# Patient Record
Sex: Female | Born: 1970
Health system: Southern US, Community
[De-identification: ages and names within clinical notes are randomized; demographics above are authoritative.]

## PROBLEM LIST (undated history)

## (undated) DIAGNOSIS — M1990 Unspecified osteoarthritis, unspecified site: Secondary | ICD-10-CM

## (undated) DIAGNOSIS — R053 Chronic cough: Secondary | ICD-10-CM

## (undated) DIAGNOSIS — K219 Gastro-esophageal reflux disease without esophagitis: Secondary | ICD-10-CM

## (undated) HISTORY — DX: Chronic cough: R05.3

## (undated) HISTORY — DX: Gastro-esophageal reflux disease without esophagitis: K21.9

## (undated) HISTORY — DX: Unspecified osteoarthritis, unspecified site: M19.90

---

## 2007-10-06 ENCOUNTER — Emergency Department (HOSPITAL_COMMUNITY): Admission: EM | Admit: 2007-10-06 | Discharge: 2007-10-06 | Payer: Self-pay | Admitting: Emergency Medicine

## 2008-02-11 ENCOUNTER — Emergency Department (HOSPITAL_COMMUNITY): Admission: EM | Admit: 2008-02-11 | Discharge: 2008-02-11 | Payer: Self-pay | Admitting: Emergency Medicine

## 2009-01-28 ENCOUNTER — Encounter: Payer: Self-pay | Admitting: Infectious Diseases

## 2009-01-28 DIAGNOSIS — M069 Rheumatoid arthritis, unspecified: Secondary | ICD-10-CM | POA: Insufficient documentation

## 2009-02-11 ENCOUNTER — Ambulatory Visit: Payer: Self-pay | Admitting: Infectious Diseases

## 2009-02-11 ENCOUNTER — Ambulatory Visit (HOSPITAL_COMMUNITY): Admission: RE | Admit: 2009-02-11 | Discharge: 2009-02-11 | Payer: Self-pay | Admitting: Infectious Diseases

## 2009-09-23 ENCOUNTER — Encounter: Admission: RE | Admit: 2009-09-23 | Discharge: 2009-09-23 | Payer: Self-pay | Admitting: Family Medicine

## 2011-05-15 ENCOUNTER — Ambulatory Visit (INDEPENDENT_AMBULATORY_CARE_PROVIDER_SITE_OTHER): Payer: BC Managed Care – PPO

## 2011-05-15 DIAGNOSIS — J111 Influenza due to unidentified influenza virus with other respiratory manifestations: Secondary | ICD-10-CM

## 2011-05-15 DIAGNOSIS — K5649 Other impaction of intestine: Secondary | ICD-10-CM

## 2012-08-22 ENCOUNTER — Ambulatory Visit (INDEPENDENT_AMBULATORY_CARE_PROVIDER_SITE_OTHER): Payer: BC Managed Care – PPO | Admitting: Family Medicine

## 2012-08-22 ENCOUNTER — Ambulatory Visit: Payer: BC Managed Care – PPO

## 2012-08-22 VITALS — BP 120/90 | HR 70 | Temp 98.3°F | Resp 16 | Wt 158.6 lb

## 2012-08-22 DIAGNOSIS — M542 Cervicalgia: Secondary | ICD-10-CM

## 2012-08-22 DIAGNOSIS — M25519 Pain in unspecified shoulder: Secondary | ICD-10-CM

## 2012-08-22 DIAGNOSIS — M25512 Pain in left shoulder: Secondary | ICD-10-CM

## 2012-08-22 DIAGNOSIS — R079 Chest pain, unspecified: Secondary | ICD-10-CM

## 2012-08-22 DIAGNOSIS — T148XXA Other injury of unspecified body region, initial encounter: Secondary | ICD-10-CM

## 2012-08-22 LAB — POCT CBC
Granulocyte percent: 47.1 %G (ref 37–80)
HCT, POC: 47 % (ref 37.7–47.9)
Hemoglobin: 15.2 g/dL (ref 12.2–16.2)
Lymph, poc: 3.4 (ref 0.6–3.4)
MCH, POC: 29.6 pg (ref 27–31.2)
MCHC: 32.3 g/dL (ref 31.8–35.4)
MCV: 91.6 fL (ref 80–97)
MID (cbc): 0.6 (ref 0–0.9)
MPV: 8.2 fL (ref 0–99.8)
POC Granulocyte: 3.6 (ref 2–6.9)
POC LYMPH PERCENT: 44.8 %L (ref 10–50)
POC MID %: 8.1 % (ref 0–12)
Platelet Count, POC: 394 10*3/uL (ref 142–424)
RBC: 5.13 M/uL (ref 4.04–5.48)
RDW, POC: 13.4 %
WBC: 7.7 10*3/uL (ref 4.6–10.2)

## 2012-08-22 MED ORDER — CYCLOBENZAPRINE HCL 5 MG PO TABS: 5.0000 mg | ORAL_TABLET | Freq: Every evening | ORAL | Status: DC | PRN

## 2012-08-22 NOTE — Progress Notes (Signed)
 [facility]:  Office Visit  Chief Complaint:  Chief Complaint  Patient presents with  . Chest Pain  . Neck Pain    feel hot on Lt side of neck and top of Lt Shoulder x yesterday afternoon    HPI: Lisa Chase is a 42 y.o. female who complains of neck pain on left  Side since yesterday, she has a sensation that muscles feel like strings taht are being pulled. Has had left anterior chest pain associated with this which also started yesterday at rest. Was napping then she woke up and had this pain, she sleeps on her left shoulder, head crooked. The CP was worse but now better, it is located in Left anterior wall. Yesterday was mild chest pain, now she feels better. Took ASA. Of pertinent interest she has RA and is methotrexate, last labs done in November.  Additionally her mom passed away from MI at age 65. Patient denies having HTN, does not know if she has high cholesterol. Does not have diabetes. Does not smoke.  She has already eaten today. No abd pain, no nausea/vomiting, pain radiates down from neck to shoulder to chest, not viceversa. No SOB, diaphoresis, palpitations, HAs, vision changes. .    Past Medical History  Diagnosis Date  . Arthritis     Rheumatoid dx 2010 on Methotrexate   History reviewed. No pertinent past surgical history. History   Social History  . Marital Status: Married    Spouse Name: N/A    Number of Children: N/A  . Years of Education: N/A   Social History Main Topics  . Smoking status: Never Smoker   . Smokeless tobacco: None  . Alcohol Use: No  . Drug Use: No  . Sexually Active: Yes   Other Topics Concern  . None   Social History Narrative  . None   Family History  Problem Relation Age of Onset  . Heart disease Mother    No Known Allergies Prior to Admission medications   Medication Sig Start Date End Date Taking? Authorizing Provider  methotrexate 1 G injection Inject into the vein once.   Yes Historical Provider, MD   predniSONE (DELTASONE) 5 MG tablet Take 5 mg by mouth daily.   Yes Historical Provider, MD     ROS: The patient denies fevers, chills, night sweats, unintentional weight loss, palpitations, wheezing, dyspnea on exertion, nausea, vomiting, abdominal pain, dysuria, hematuria, melena, numbness, weakness, or tingling.   All other systems have been reviewed and were otherwise negative with the exception of those mentioned in the HPI and as above.    PHYSICAL EXAM: Filed Vitals:   08/22/12 1954  BP: 120/90  Pulse: 70  Temp: 98.3 F (36.8 C)  Resp: 16   Filed Vitals:   08/22/12 1954  Weight: 158 lb 9.6 oz (71.94 kg)   Body mass index is 28.1 kg/(m^2).  General: Alert, no acute distress HEENT:  Normocephalic, atraumatic, oropharynx patent. EOMI, PERRLA Cardiovascular:  Regular rate and rhythm, no rubs murmurs or gallops.  No Carotid bruits, radial pulse intact. No pedal edema.  Respiratory: Clear to auscultation bilaterally.  No wheezes, rales, or rhonchi.  No cyanosis, no use of accessory musculature GI: No organomegaly, abdomen is soft and non-tender, positive bowel sounds.  No masses. Skin: No rashes. Neurologic: Facial musculature symmetric. Psychiatric: Patient is appropriate throughout our interaction. Lymphatic: No cervical lymphadenopathy Musculoskeletal: Gait intact. Neck exam-guarded. + paramsk tenderness, left more than right. Slight decrease in lateral  ROM  due to pain.  Thoracic and lumbar-normal Left shoulder-+ crepitus. Pain with Adduction. Full ROM, 5/5 neg Hawkins, Neg Neers. Neg Empty Can.  Chest  Wall-nontender with palpation.  2/2 DTRs  LABS: Results for orders placed in visit on 08/22/12  POCT CBC      Result Value Range   WBC 7.7  4.6 - 10.2 K/uL   Lymph, poc 3.4  0.6 - 3.4   POC LYMPH PERCENT 44.8  10 - 50 %L   MID (cbc) 0.6  0 - 0.9   POC MID % 8.1  0 - 12 %M   POC Granulocyte 3.6  2 - 6.9   Granulocyte percent 47.1  37 - 80 %G   RBC 5.13  4.04 -  5.48 M/uL   Hemoglobin 15.2  12.2 - 16.2 g/dL   HCT, POC 16.1  09.6 - 47.9 %   MCV 91.6  80 - 97 fL   MCH, POC 29.6  27 - 31.2 pg   MCHC 32.3  31.8 - 35.4 g/dL   RDW, POC 04.5     Platelet Count, POC 394  142 - 424 K/uL   MPV 8.2  0 - 99.8 fL     EKG/XRAY:   Primary read interpreted by Dr. Conley Rolls at [facility]. C-spine normal Chest-unchanged from 07/29/2010, no acute cardiopulmonary process Shoulder-+ minimal [facility] at [facility] jt EKG NSR 79 bpm    ASSESSMENT/PLAN: Encounter Diagnoses  Name Primary?  . Neck pain on left side Yes  . Pain in joint, shoulder region, left   . Chest pain   . Sprain and strain    She most likely has sprain and strain from sleeping wrong Rx Flexeril, ROM exercise Patient still has tramadol which she can take Awaiting CMP EKG, Chest xray and CBC were WNL so I don't think it is cardiac in origin  Go to ER prn for worsening CP/SOB   ,  PHUONG, DO 08/22/2012 9:25 PM

## 2012-08-23 LAB — COMPREHENSIVE METABOLIC PANEL WITH GFR
Albumin: 4.5 g/dL (ref 3.5–5.2)
Alkaline Phosphatase: 56 U/L (ref 39–117)
Calcium: 9.8 mg/dL (ref 8.4–10.5)
Creat: 0.92 mg/dL (ref 0.50–1.10)
Potassium: 4.2 meq/L (ref 3.5–5.3)
Sodium: 137 meq/L (ref 135–145)

## 2012-08-23 LAB — COMPREHENSIVE METABOLIC PANEL
ALT: 65 U/L — ABNORMAL HIGH (ref 0–35)
AST: 69 U/L — ABNORMAL HIGH (ref 0–37)
BUN: 12 mg/dL (ref 6–23)
CO2: 22 mEq/L (ref 19–32)
Chloride: 105 mEq/L (ref 96–112)
Glucose, Bld: 96 mg/dL (ref 70–99)
Total Bilirubin: 0.4 mg/dL (ref 0.3–1.2)
Total Protein: 7.8 g/dL (ref 6.0–8.3)

## 2012-08-24 ENCOUNTER — Telehealth: Payer: Self-pay | Admitting: Family Medicine

## 2012-08-24 ENCOUNTER — Encounter: Payer: Self-pay | Admitting: Family Medicine

## 2012-08-24 NOTE — Telephone Encounter (Signed)
LM on phone about lab results. Letter sent as well.

## 2014-04-16 ENCOUNTER — Encounter (HOSPITAL_BASED_OUTPATIENT_CLINIC_OR_DEPARTMENT_OTHER): Payer: Self-pay

## 2014-04-16 ENCOUNTER — Emergency Department (HOSPITAL_BASED_OUTPATIENT_CLINIC_OR_DEPARTMENT_OTHER)
Admission: EM | Admit: 2014-04-16 | Discharge: 2014-04-16 | Disposition: A | Discharge status: Home / Self Care | Payer: BLUE CROSS/BLUE SHIELD | Attending: Emergency Medicine | Admitting: Emergency Medicine

## 2014-04-16 DIAGNOSIS — R197 Diarrhea, unspecified: Secondary | ICD-10-CM | POA: Diagnosis not present

## 2014-04-16 DIAGNOSIS — Z7952 Long term (current) use of systemic steroids: Secondary | ICD-10-CM | POA: Insufficient documentation

## 2014-04-16 DIAGNOSIS — E860 Dehydration: Secondary | ICD-10-CM | POA: Diagnosis not present

## 2014-04-16 DIAGNOSIS — R52 Pain, unspecified: Secondary | ICD-10-CM | POA: Diagnosis present

## 2014-04-16 DIAGNOSIS — R51 Headache: Secondary | ICD-10-CM | POA: Diagnosis not present

## 2014-04-16 DIAGNOSIS — R1110 Vomiting, unspecified: Secondary | ICD-10-CM | POA: Diagnosis not present

## 2014-04-16 LAB — COMPREHENSIVE METABOLIC PANEL
ALBUMIN: 3.7 g/dL (ref 3.5–5.2)
ALK PHOS: 51 U/L (ref 39–117)
ALT: 82 U/L — ABNORMAL HIGH (ref 0–35)
ANION GAP: 14 (ref 5–15)
AST: 109 U/L — ABNORMAL HIGH (ref 0–37)
BILIRUBIN TOTAL: 0.3 mg/dL (ref 0.3–1.2)
BUN: 8 mg/dL (ref 6–23)
CALCIUM: 8.9 mg/dL (ref 8.4–10.5)
CO2: 24 mEq/L (ref 19–32)
Chloride: 102 mEq/L (ref 96–112)
Creatinine, Ser: 0.9 mg/dL (ref 0.50–1.10)
GFR calc Af Amer: 89 mL/min — ABNORMAL LOW (ref >90)
GFR, EST NON AFRICAN AMERICAN: 77 mL/min — AB (ref >90)
Glucose, Bld: 110 mg/dL — ABNORMAL HIGH (ref 70–99)
POTASSIUM: 3.9 meq/L (ref 3.7–5.3)
SODIUM: 140 meq/L (ref 137–147)
Total Protein: 7.8 g/dL (ref 6.0–8.3)

## 2014-04-16 LAB — CBC WITH DIFFERENTIAL/PLATELET
Basophils Absolute: 0 10*3/uL (ref 0.0–0.1)
Basophils Relative: 0 % (ref 0–1)
EOS ABS: 0 10*3/uL (ref 0.0–0.7)
Eosinophils Relative: 0 % (ref 0–5)
HEMATOCRIT: 45.5 % (ref 36.0–46.0)
HEMOGLOBIN: 15.4 g/dL — AB (ref 12.0–15.0)
LYMPHS ABS: 1.2 10*3/uL (ref 0.7–4.0)
LYMPHS PCT: 56 % — AB (ref 12–46)
MCH: 29.2 pg (ref 26.0–34.0)
MCHC: 33.8 g/dL (ref 30.0–36.0)
MCV: 86.3 fL (ref 78.0–100.0)
MONO ABS: 0.3 10*3/uL (ref 0.1–1.0)
MONOS PCT: 16 % — AB (ref 3–12)
NEUTROS ABS: 0.6 10*3/uL — AB (ref 1.7–7.7)
Neutrophils Relative %: 28 % — ABNORMAL LOW (ref 43–77)
PLATELETS: 167 10*3/uL (ref 150–400)
RBC: 5.27 MIL/uL — ABNORMAL HIGH (ref 3.87–5.11)
RDW: 14.2 % (ref 11.5–15.5)
WBC: 2.1 10*3/uL — AB (ref 4.0–10.5)

## 2014-04-16 LAB — URINE MICROSCOPIC-ADD ON

## 2014-04-16 LAB — URINALYSIS, ROUTINE W REFLEX MICROSCOPIC
BILIRUBIN URINE: NEGATIVE
Glucose, UA: NEGATIVE mg/dL
KETONES UR: 15 mg/dL — AB
Leukocytes, UA: NEGATIVE
NITRITE: NEGATIVE
PH: 7 (ref 5.0–8.0)
Specific Gravity, Urine: 1.025 (ref 1.005–1.030)
UROBILINOGEN UA: 1 mg/dL (ref 0.0–1.0)

## 2014-04-16 MED ORDER — ONDANSETRON HCL 4 MG/2ML IJ SOLN
4.0000 mg | Freq: Once | INTRAMUSCULAR | Status: AC
  Administered 2014-04-16: 4 mg via INTRAVENOUS
  Filled 2014-04-16: qty 2

## 2014-04-16 MED ORDER — CIPROFLOXACIN HCL 500 MG PO TABS: 500.0000 mg | ORAL_TABLET | Freq: Two times a day (BID) | ORAL | Status: DC

## 2014-04-16 MED ORDER — LOPERAMIDE HCL 2 MG PO CAPS
2.0000 mg | ORAL_CAPSULE | Freq: Once | ORAL | Status: AC
  Administered 2014-04-16: 2 mg via ORAL
  Filled 2014-04-16: qty 1

## 2014-04-16 MED ORDER — PROMETHAZINE HCL 25 MG PO TABS: 25.0000 mg | ORAL_TABLET | Freq: Four times a day (QID) | ORAL | Status: DC | PRN

## 2014-04-16 MED ORDER — SODIUM CHLORIDE 0.9 % IV BOLUS (SEPSIS)
1000.0000 mL | Freq: Once | INTRAVENOUS | Status: AC
  Administered 2014-04-16: 1000 mL via INTRAVENOUS

## 2014-04-16 NOTE — ED Notes (Signed)
Pt reports body aches, chills,no fever,nausea but no vomiting. [facility].

## 2014-04-16 NOTE — ED Provider Notes (Signed)
CSN: [id number]     Arrival date & time 04/16/14  1116 History   First MD Initiated Contact with Patient 04/16/14 1134     Chief Complaint  Patient presents with  . Generalized Body Aches     (Consider location/radiation/quality/duration/timing/severity/associated sxs/prior Treatment) Patient is a 43 y.o. female presenting with vomiting. The history is provided by the patient.  Emesis Severity:  Moderate Duration:  4 days Timing:  Sporadic Number of daily episodes:  Only a few Quality:  Stomach contents Progression:  Unchanged Chronicity:  New Recent urination:  Normal Relieved by:  Nothing Associated symptoms: chills, diarrhea, fever and headaches   Associated symptoms: no abdominal pain, no cough, no sore throat and no URI   Diarrhea:    Quality:  Watery   Number of occurrences:  4-5 episodes a day but much worse if she eats   Severity:  Moderate   Duration:  4 days   Timing:  Constant   Progression:  Unchanged Risk factors: travel to endemic areas   Risk factors: no alcohol use and no prior abdominal surgery   Risk factors comment:  Recently traveled back from [place] last week where she had been for the last month   Past Medical History  Diagnosis Date  . Arthritis     Rheumatoid dx 2010 on Methotrexate   History reviewed. No pertinent past surgical history. Family History  Problem Relation Age of Onset  . Heart disease Mother    History  Substance Use Topics  . Smoking status: Never Smoker   . Smokeless tobacco: Not on file  . Alcohol Use: No   OB History    No data available     Review of Systems  Constitutional: Positive for chills.  HENT: Negative for sore throat.   Gastrointestinal: Positive for vomiting and diarrhea. Negative for abdominal pain.  Neurological: Positive for headaches.  All other systems reviewed and are negative.     Allergies  Review of patient's allergies indicates no known allergies.  Home Medications   Prior to  Admission medications   Medication Sig Start Date End Date Taking? Authorizing Provider  cyclobenzaprine (FLEXERIL) 5 MG tablet Take 1 tablet (5 mg total) by mouth at bedtime as needed for muscle spasms. 08/22/12   Thao P Le, DO  methotrexate 1 G injection Inject into the vein once.    Historical Provider, MD  predniSONE (DELTASONE) 5 MG tablet Take 5 mg by mouth daily.    Historical Provider, MD   BP 116/47 mmHg  Pulse 64  Temp(Src) 98 F (36.7 C) (Oral)  Resp 22  SpO2 99%  LMP 04/11/2014 Physical Exam  Constitutional: She is oriented to person, place, and time. She appears well-developed and well-nourished. No distress.  HENT:  Head: Normocephalic and atraumatic.  Mouth/Throat: Mucous membranes are dry.  Eyes: EOM are normal. Pupils are equal, round, and reactive to light.  Cardiovascular: Normal rate, regular rhythm, normal heart sounds and intact distal pulses.  Exam reveals no friction rub.   No murmur heard. Pulmonary/Chest: Effort normal and breath sounds normal. She has no wheezes. She has no rales.  Abdominal: Soft. Bowel sounds are normal. She exhibits no distension. There is no tenderness. There is no rebound and no guarding.  Musculoskeletal: Normal range of motion. She exhibits no tenderness.  Chronic deformity in the hands consistent with RA.  No edema  Neurological: She is alert and oriented to person, place, and time. No cranial nerve deficit.  Skin: Skin is  warm and dry. No rash noted.  Psychiatric: She has a normal mood and affect. Her behavior is normal.  Nursing note and vitals reviewed.   ED Course  Procedures (including critical care time) Labs Review Labs Reviewed  CBC WITH DIFFERENTIAL - Abnormal; Notable for the following:    WBC 2.1 (*)    RBC 5.27 (*)    Hemoglobin 15.4 (*)    Neutrophils Relative % 28 (*)    Lymphocytes Relative 56 (*)    Monocytes Relative 16 (*)    Neutro Abs 0.6 (*)    All other components within normal limits  COMPREHENSIVE  METABOLIC PANEL - Abnormal; Notable for the following:    Glucose, Bld 110 (*)    AST 109 (*)    ALT 82 (*)    GFR calc non Af Amer 77 (*)    GFR calc Af Amer 89 (*)    All other components within normal limits  URINALYSIS, ROUTINE W REFLEX MICROSCOPIC - Abnormal; Notable for the following:    Color, Urine AMBER (*)    APPearance CLOUDY (*)    Hgb urine dipstick MODERATE (*)    Ketones, ur 15 (*)    Protein, ur >300 (*)    All other components within normal limits  URINE MICROSCOPIC-ADD ON - Abnormal; Notable for the following:    Squamous Epithelial / LPF FEW (*)    Bacteria, UA MANY (*)    All other components within normal limits  OVA AND PARASITE EXAMINATION  GI PATHOGEN PANEL BY PCR, STOOL  PATHOLOGIST SMEAR REVIEW    Imaging Review No results found.   EKG Interpretation None      MDM   Final diagnoses:  None    Patient here with 4 days of fever, chills, nausea and diarrhea. Also complaining of some lower back pain. Patient denies any abdominal pain, respiratory or cardiac symptoms. She recently traveled back from [place] where she was for month.  Patient is immunocompromised as she takes methotrexate and prednisone for rheumatoid arthritis. She currently is well-appearing with normal vital signs. She has no focal abdominal tenderness but concern for possible foodborne illness or parasitic infection.  CBC, CMP, stool ova and parasite as well as GI panel by PCR pending. Patient given IV fluids and Zofran  2:03 PM Pt po challenged without vomiting.  Given imodium for diarrhea.  Labs show luekocytosis which she will have f/u and recheck as well as mild elevation of LFT's which is most likely due to dehydration.  Pt will be treated with cipro.  Gwyneth Sprout, MD 04/16/14 [phone]

## 2014-04-16 NOTE — ED Notes (Signed)
Pt presents with numerous complaints, nausea, headache, diarrhea, cough, dizziness and pain to back.

## 2014-04-16 NOTE — ED Notes (Signed)
MD at bedside. 

## 2014-04-16 NOTE — Discharge Instructions (Signed)
Dehydration, Adult Dehydration is when you lose more fluids from the body than you take in. Vital organs like the kidneys, brain, and heart cannot function without a proper amount of fluids and salt. Any loss of fluids from the body can cause dehydration.  CAUSES   Vomiting.  Diarrhea.  Excessive sweating.  Excessive urine output.  Fever. SYMPTOMS  Mild dehydration  Thirst.  Dry lips.  Slightly dry mouth. Moderate dehydration  Very dry mouth.  Sunken eyes.  Skin does not bounce back quickly when lightly pinched and released.  Dark urine and decreased urine production.  Decreased tear production.  Headache. Severe dehydration  Very dry mouth.  Extreme thirst.  Rapid, weak pulse (more than 100 beats per minute at rest).  Cold hands and feet.  Not able to sweat in spite of heat and temperature.  Rapid breathing.  Blue lips.  Confusion and lethargy.  Difficulty being awakened.  Minimal urine production.  No tears. DIAGNOSIS  Your caregiver will diagnose dehydration based on your symptoms and your exam. Blood and urine tests will help confirm the diagnosis. The diagnostic evaluation should also identify the cause of dehydration. TREATMENT  Treatment of mild or moderate dehydration can often be done at home by increasing the amount of fluids that you drink. It is best to drink small amounts of fluid more often. Drinking too much at one time can make vomiting worse. Refer to the home care instructions below. Severe dehydration needs to be treated at the hospital where you will probably be given intravenous (IV) fluids that contain water and electrolytes. HOME CARE INSTRUCTIONS   Ask your caregiver about specific rehydration instructions.  Drink enough fluids to keep your urine clear or pale yellow.  Drink small amounts frequently if you have nausea and vomiting.  Eat as you normally do.  Avoid:  Foods or drinks high in sugar.  Carbonated  drinks.  Juice.  Extremely hot or cold fluids.  Drinks with caffeine.  Fatty, greasy foods.  Alcohol.  Tobacco.  Overeating.  Gelatin desserts.  Wash your hands well to avoid spreading bacteria and viruses.  Only take over-the-counter or prescription medicines for pain, discomfort, or fever as directed by your caregiver.  Ask your caregiver if you should continue all prescribed and over-the-counter medicines.  Keep all follow-up appointments with your caregiver. SEEK MEDICAL CARE IF:  You have abdominal pain and it increases or stays in one area (localizes).  You have a rash, stiff neck, or severe headache.  You are irritable, sleepy, or difficult to awaken.  You are weak, dizzy, or extremely thirsty. SEEK IMMEDIATE MEDICAL CARE IF:   You are unable to keep fluids down or you get worse despite treatment.  You have frequent episodes of vomiting or diarrhea.  You have blood or green matter (bile) in your vomit.  You have blood in your stool or your stool looks black and tarry.  You have not urinated in 6 to 8 hours, or you have only urinated a small amount of very dark urine.  You have a fever.  You faint. MAKE SURE YOU:   Understand these instructions.  Will watch your condition.  Will get help right away if you are not doing well or get worse. Document Released: 05/23/2005 Document Revised: 08/15/2011 Document Reviewed: 01/10/2011 ExitCare Patient Information 2015 [employer]. This information is not intended to replace advice given to you by your health care provider. Make sure you discuss any questions you have with your health care   provider.  

## 2014-04-17 LAB — GI PATHOGEN PANEL BY PCR, STOOL
C DIFFICILE TOXIN A/B: NEGATIVE
CAMPYLOBACTER BY PCR: NEGATIVE
CRYPTOSPORIDIUM BY PCR: NEGATIVE
E COLI (ETEC) LT/ST: NEGATIVE
E coli (STEC): NEGATIVE
E coli 0157 by PCR: NEGATIVE
G lamblia by PCR: NEGATIVE
NOROVIRUS G1/G2: NEGATIVE
Rotavirus A by PCR: NEGATIVE
SHIGELLA BY PCR: NEGATIVE
Salmonella by PCR: NEGATIVE

## 2014-04-17 LAB — PATHOLOGIST SMEAR REVIEW

## 2016-08-08 MED FILL — NAPROXEN 500 MG TABLET: 500 | 90 days supply | Qty: 180 | Fill #0

## 2016-08-08 MED FILL — FOLIC ACID 1 MG TABLET: 1 | 90 days supply | Qty: 90 | Fill #0

## 2016-08-08 MED FILL — predniSONE 10 MG TABS: 10 | 90 days supply | Qty: 90 | Fill #0

## 2016-08-09 MED FILL — METHOTREXATE 25 MG/ML VIAL: 50 | 28 days supply | Qty: 4 | Fill #0

## 2016-08-09 MED FILL — BD TB SYRINGE 27GX1/2: 27G X 1/2" | 28 days supply | Qty: 4 | Fill #0

## 2016-08-09 MED FILL — BD TB SYRINGE 27GX1/2": 27G X 1/2" | 28 days supply | Qty: 4 | Fill #0

## 2016-09-28 DIAGNOSIS — Z79899 Other long term (current) drug therapy: Secondary | ICD-10-CM | POA: Diagnosis not present

## 2016-09-28 DIAGNOSIS — M0589 Other rheumatoid arthritis with rheumatoid factor of multiple sites: Secondary | ICD-10-CM | POA: Diagnosis not present

## 2016-09-28 DIAGNOSIS — R7611 Nonspecific reaction to tuberculin skin test without active tuberculosis: Secondary | ICD-10-CM | POA: Diagnosis not present

## 2016-09-28 DIAGNOSIS — R5383 Other fatigue: Secondary | ICD-10-CM | POA: Diagnosis not present

## 2017-01-17 MED FILL — FOLIC ACID 1 MG TABLET: 1 | 90 days supply | Qty: 90 | Fill #0

## 2017-01-17 MED FILL — NAPROXEN 500 MG TABLET: 500 | 90 days supply | Qty: 180 | Fill #0

## 2017-01-26 ENCOUNTER — Encounter (HOSPITAL_COMMUNITY): Payer: Self-pay | Admitting: Emergency Medicine

## 2017-01-26 ENCOUNTER — Ambulatory Visit (HOSPITAL_COMMUNITY)
Admission: EM | Admit: 2017-01-26 | Discharge: 2017-01-26 | Disposition: A | Discharge status: Home / Self Care | Payer: 59 | Attending: Emergency Medicine | Admitting: Emergency Medicine

## 2017-01-26 DIAGNOSIS — M25611 Stiffness of right shoulder, not elsewhere classified: Secondary | ICD-10-CM

## 2017-01-26 DIAGNOSIS — H6691 Otitis media, unspecified, right ear: Secondary | ICD-10-CM | POA: Diagnosis not present

## 2017-01-26 DIAGNOSIS — M62838 Other muscle spasm: Secondary | ICD-10-CM

## 2017-01-26 DIAGNOSIS — M25511 Pain in right shoulder: Secondary | ICD-10-CM

## 2017-01-26 MED ORDER — CYCLOBENZAPRINE HCL 5 MG PO TABS: 5.0000 mg | ORAL_TABLET | Freq: Two times a day (BID) | ORAL | 0 refills | Status: DC | PRN

## 2017-01-26 MED ORDER — AZITHROMYCIN 250 MG PO TABS: 250.0000 mg | ORAL_TABLET | Freq: Every day | ORAL | 0 refills | Status: DC

## 2017-01-26 MED FILL — CYCLOBENZAPRINE 5 MG TABLET: 5 | 5 days supply | Qty: 20 | Fill #0

## 2017-01-26 MED FILL — AZITHROMYCIN 250 MG TABLET: 250 | 5 days supply | Qty: 6 | Fill #0

## 2017-01-26 NOTE — ED Provider Notes (Signed)
[facility]    CSN: [id number] Arrival date & time: 01/26/17  1002     History   Chief Complaint Chief Complaint  Patient presents with  . Otalgia  . Extremity Pain    HPI Lisa Chase is a 46 y.o. female.   HPI Lisa Chase is a 46 y.o. female presenting to UC with c/o sudden onset Right shoulder pain with decreased ROM as well as Right ear pain that is aching and throbbing. Both symptoms started yesterday.  She notes she has been typing more recently and is Right hand dominant. No known injury.  Pain is 6/10, aching and sore. Hx of arthritis.  She is on methotrexate.  She did take Naprosyn that was prescribed by her PCP in the past. No relief. She is new to the area and plans to find a PCP soon. Right ear pain is aching and sore, mild to moderate in severity. She has had mild cough and congestion for about 1 week. Denies fever, chills, n/v/d.    Past Medical History:  Diagnosis Date  . Arthritis    Rheumatoid dx 2010 on Methotrexate    Patient Active Problem List   Diagnosis Date Noted  . ARTHRITIS, RHEUMATOID 01/28/2009  . 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 No pertinent surgical history.  OB History    No data available       Home Medications    Prior to Admission medications   Medication Sig Start Date End Date Taking? Authorizing Provider  azithromycin (ZITHROMAX) 250 MG tablet Take 1 tablet (250 mg total) by mouth daily. Take first 2 tablets together, then 1 every day until finished. 01/26/17   Lurene Shadow, PA-C  ciprofloxacin (CIPRO) 500 MG tablet Take 1 tablet (500 mg total) by mouth 2 (two) times daily. 04/16/14   Gwyneth Sprout, MD  cyclobenzaprine (FLEXERIL) 5 MG tablet Take 1-2 tablets (5-10 mg total) by mouth 2 (two) times daily as needed for muscle spasms. 01/26/17   Lurene Shadow, PA-C  methotrexate 1 G injection Inject into the vein once.    [provider]  predniSONE (DELTASONE) 5 MG tablet Take 5 mg  by mouth daily.    [provider]  promethazine (PHENERGAN) 25 MG tablet Take 1 tablet (25 mg total) by mouth every 6 (six) hours as needed for nausea or vomiting. 04/16/14   Gwyneth Sprout, MD    Family History Family History  Problem Relation Age of Onset  . Heart disease Mother     Social History Social History  Substance Use Topics  . Smoking status: Never Smoker  . Smokeless tobacco: Never Used  . Alcohol use No     Allergies   Patient has no known allergies.   Review of Systems Review of Systems  Constitutional: Negative for chills and fever.  HENT: Positive for congestion, ear pain (Right) and rhinorrhea. Negative for sore throat, trouble swallowing and voice change.   Respiratory: Positive for cough. Negative for shortness of breath.   Cardiovascular: Negative for chest pain and palpitations.  Gastrointestinal: Negative for abdominal pain, diarrhea, nausea and vomiting.  Musculoskeletal: Positive for arthralgias, back pain and myalgias. Negative for joint swelling.  Skin: Negative for rash.  Neurological: Positive for weakness ( Right arm due to pain and stiffness). Negative for numbness.     Physical Exam Triage Vital Signs ED Triage Vitals  Enc Vitals Group     BP 01/26/17 1022 (!) 145/80  Pulse Rate 01/26/17 1022 72     Resp 01/26/17 1022 16     Temp 01/26/17 1022 98.1 F (36.7 C)     Temp Source 01/26/17 1022 Oral     SpO2 01/26/17 1022 98 %     Weight 01/26/17 1029 159 lb (72.1 kg)     Height 01/26/17 1029 5\' 3"  (1.6 m)     Head Circumference --      Peak Flow --      Pain Score 01/26/17 1029 6     Pain Loc --      Pain Edu? --      Excl. in GC? --    No data found.   Updated Vital Signs BP (!) 145/80 (BP Location: Left Arm) Comment: reported BP to nurse 01/28/17  Pulse 72   Temp 98.1 F (36.7 C) (Oral)   Resp 16   Ht 5\' 3"  (1.6 m)   Wt 159 lb (72.1 kg)   SpO2 98%   BMI 28.17 kg/m   Visual Acuity Right Eye  Distance:   Left Eye Distance:   Bilateral Distance:    Right Eye Near:   Left Eye Near:    Bilateral Near:     Physical Exam  Constitutional: She is oriented to person, place, and time. She appears well-developed and well-nourished. No distress.  HENT:  Head: Normocephalic and atraumatic.  Right Ear: Tympanic membrane is erythematous. Tympanic membrane is not bulging. A middle ear effusion is present.  Left Ear: Tympanic membrane normal.  Nose: Nose normal.  Mouth/Throat: Uvula is midline, oropharynx is clear and moist and mucous membranes are normal.  Eyes: EOM are normal.  Neck: Normal range of motion. Neck supple.  No midline spinal tenderness. Tenderness to Right side cervical muscles.   Cardiovascular: Normal rate and regular rhythm.   Pulmonary/Chest: Effort normal and breath sounds normal. No stridor. No respiratory distress. She has no wheezes. She has no rales.  Musculoskeletal: She exhibits tenderness.  No midline spinal tenderness. Tenderness to Right upper trapezius and posterior shoulder joint. Decreased ROM. Unable to abduct arm past 90 degrees due to pain and stiffness. No crepitus felt on exam. Able to fully adduct Right arm. 4/5 grip strength bilaterally. Bilateral hands- slight deformity c/w RA  Lymphadenopathy:    She has no cervical adenopathy.  Neurological: She is alert and oriented to person, place, and time.  Skin: Skin is warm and dry. No rash noted. She is not diaphoretic. No erythema.  Psychiatric: She has a normal mood and affect. Her behavior is normal.  Nursing note and vitals reviewed.    UC Treatments / Results  Labs (all labs ordered are listed, but only abnormal results are displayed) Labs Reviewed - No data to display  EKG  EKG Interpretation None       Radiology No results found.  Procedures Procedures (including critical care time)  Medications Ordered in UC Medications - No data to display   Initial Impression / Assessment  and Plan / UC Course  I have reviewed the triage vital signs and the nursing notes.  Pertinent labs & imaging results that were available during my care of the patient were reviewed by me and considered in my medical decision making (see chart for details).     Right shoulder pain and decreased ROM secondary to muscle spasm of Right upper trapezius. No known injury. No bony tenderness. Known hx of arthritis. No indication for imaging at this time. Will try conservative  treatment.  Right ear exam c/w AOM  Final Clinical Impressions(s) / UC Diagnoses   Final diagnoses:  Right acute otitis media  Trapezius muscle spasm  Acute pain of right shoulder  Decreased range of motion of right shoulder   Home care instructions provided Encouraged f/u with a PCP and orthopedist for Right shoulder.   New Prescriptions New Prescriptions   AZITHROMYCIN (ZITHROMAX) 250 MG TABLET    Take 1 tablet (250 mg total) by mouth daily. Take first 2 tablets together, then 1 every day until finished.   CYCLOBENZAPRINE (FLEXERIL) 5 MG TABLET    Take 1-2 tablets (5-10 mg total) by mouth 2 (two) times daily as needed for muscle spasms.     Controlled Substance Prescriptions [place] Controlled Substance Registry consulted? Not Applicable   Rolla Plate 01/26/17 1111

## 2017-01-26 NOTE — ED Triage Notes (Signed)
PT reports right arm pain with inability to lift arm all the way. This started yesterday. History of arthritis.   PT reports right ear pain that started yesterday.

## 2017-01-26 NOTE — Discharge Instructions (Signed)
°  No Primary Care Doctor: Call [facility] at  [phone] - they can help you locate a primary care doctor that  accepts your insurance, provides certain services, etc. Physician Referral Service- [phone]  Please contact one of the orthopedic clinics listed above for further evaluated and treatment of Right shoulder pain and stiffness. Once you establish care with a Primary Care Provider, they may refer you to a rheumatologist for further evaluation and treatment of shoulder pain and stiffness.

## 2017-02-09 ENCOUNTER — Ambulatory Visit (HOSPITAL_COMMUNITY)
Admission: EM | Admit: 2017-02-09 | Discharge: 2017-02-09 | Disposition: A | Discharge status: Home / Self Care | Payer: 59 | Attending: Internal Medicine | Admitting: Internal Medicine

## 2017-02-09 ENCOUNTER — Encounter (HOSPITAL_COMMUNITY): Payer: Self-pay | Admitting: Nurse Practitioner

## 2017-02-09 DIAGNOSIS — B9789 Other viral agents as the cause of diseases classified elsewhere: Secondary | ICD-10-CM

## 2017-02-09 DIAGNOSIS — J069 Acute upper respiratory infection, unspecified: Secondary | ICD-10-CM | POA: Diagnosis not present

## 2017-02-09 MED ORDER — IPRATROPIUM BROMIDE 0.06 % NA SOLN: 2.0000 | Freq: Four times a day (QID) | NASAL | 0 refills | Status: DC

## 2017-02-09 MED ORDER — GUAIFENESIN-CODEINE 100-10 MG/5ML PO SOLN: 5.0000 mL | Freq: Three times a day (TID) | ORAL | 0 refills | Status: DC | PRN

## 2017-02-09 NOTE — ED Triage Notes (Signed)
Pt presents with c/o cough. The cough began two days ago. The cough is nonproductive. She feels short of breath. She denies fevers, pain. She has tried tylenol cough/cold with no relief. She has been unable to sleep for the past two night due to cough

## 2017-02-09 NOTE — Discharge Instructions (Signed)
You most likely have a viral URI, this type of infection will not be helped by antibiotics.  For your symptoms I have prescribed ipratropium nasal spray, 2 sprays each nostril up to 4 times a day. I have also prescribed a medicine for cough called Cheratussin with codeine, this medicine is a narcotic, it will cause drowsiness, and it is addictive. Do not take more than what is necessary, do not drink alcohol while taking, and do not operate any heavy machinery while taking this medicine.    In addition to these therapies, I advise rest, plenty of fluids and management of symptoms with over the counter medicines. Over the counter therapies for your symptoms include:Tylenol as needed every 4-6 hours for body aches or fever, not to exceed 4,000 mg a day, Take mucinex or mucinex DM ever 12 hours with a full glass of water, you may use an inhaled steroid such as Flonase, 2 sprays each nostril once a day for congestion, or an antihistamine such as Claritin or Zyrtec once a day. Another alternative for congestion, is a pseudoephedrine containing product available from the pharmacist. Should your symptoms worsen or fail to resolve, follow up with your primary care provider or return to clinic.

## 2017-02-09 NOTE — ED Provider Notes (Signed)
[facility]   [id number] 02/09/17 Arrival Time: 1704   SUBJECTIVE:  Kymani Laursen is a 46 y.o. female who presents to the urgent care with complaint of cough for 24 hours, interfering with her sleep. Along with nasal congestion and runny nose as well. No ear pain or pressure, no nausea, vomiting, diarrhea, no chest pain or palpitations, wheezing, or shortness of breath. No fever or chills, no change in appetite. Does not smoke, works as a Pharmacologist.     Past Medical History:  Diagnosis Date  . Arthritis    Rheumatoid dx 2010 on Methotrexate   Family History  Problem Relation Age of Onset  . Heart disease Mother    Social History   Social History  . Marital status: Married    Spouse name: N/A  . Number of children: N/A  . Years of education: N/A   Occupational History  . Not on file.   Social History Main Topics  . Smoking status: Never Smoker  . Smokeless tobacco: Never Used  . Alcohol use No  . Drug use: No  . Sexual activity: Yes   Other Topics Concern  . Not on file   Social History Narrative  . No narrative on file   Current Meds  Medication Sig  . folic acid (FOLVITE) 1 MG tablet Take 1 mg by mouth daily.  . methotrexate 1 G injection Inject into the vein once.  . [DISCONTINUED] predniSONE (DELTASONE) 5 MG tablet Take 5 mg by mouth daily.   No Known Allergies    ROS: As per HPI, remainder of ROS negative.   OBJECTIVE:   Vitals:   02/09/17 1721  BP: 135/76  Pulse: 81  Resp: 16  Temp: 98.8 F (37.1 C)  TempSrc: Oral  SpO2: 97%     General appearance: alert; no distress Eyes: PERRL; EOMI; conjunctiva normal HENT: normocephalic; atraumatic; TMs normal, canal normal, external ears normal without trauma; nasal mucosa normal; oral mucosa normal Neck: suppleNo cervical lymphadenopathy Lungs: clear to auscultation bilaterally Heart: regular rate and rhythm Abdomen: soft, non-tender; bowel sounds normal; no masses or  organomegaly; no guarding or rebound tenderness Back: no CVA tenderness Extremities: no cyanosis or edema; symmetrical with no gross deformities Skin: warm and dry Neurologic: normal gait; grossly normal Psychological: alert and cooperative; normal mood and affect      Labs:   Labs Reviewed - No data to display  No results found.     ASSESSMENT & PLAN:  1. Viral URI with cough     Meds ordered this encounter  Medications  . folic acid (FOLVITE) 1 MG tablet    Sig: Take 1 mg by mouth daily.  Marland Kitchen ipratropium (ATROVENT) 0.06 % nasal spray    Sig: Place 2 sprays into both nostrils 4 (four) times daily.    Dispense:  15 mL    Refill:  0    Order Specific Question:   Supervising Provider    Answer:   Eustace Moore [[id number]]  . guaiFENesin-codeine 100-10 MG/5ML syrup    Sig: Take 5 mLs by mouth 3 (three) times daily as needed for cough.    Dispense:  120 mL    Refill:  0    Order Specific Question:   Supervising Provider    Answer:   Eustace Moore [[id number]]    Reviewed expectations re: course of current medical issues. Questions answered. Outlined signs and symptoms indicating need for more acute intervention. Patient verbalized understanding. After Visit Summary  given.    Procedures:        Dorena Bodo, NP 02/09/17 1759

## 2017-02-10 MED FILL — IPRATROPIUM 0.06% SPRAY: 0.06 | 10 days supply | Qty: 15 | Fill #0

## 2017-03-17 MED FILL — HYDROCODON-APAP 5-325: 5-325 | 4 days supply | Qty: 25 | Fill #0

## 2017-03-17 MED FILL — AMOXICILLIN 500 MG CAPSULE: 500 | 3 days supply | Qty: 9 | Fill #0

## 2017-08-09 ENCOUNTER — Ambulatory Visit: Payer: BLUE CROSS/BLUE SHIELD | Admitting: Family Medicine

## 2017-08-10 ENCOUNTER — Encounter: Payer: Self-pay | Admitting: Family Medicine

## 2017-08-10 ENCOUNTER — Encounter (INDEPENDENT_AMBULATORY_CARE_PROVIDER_SITE_OTHER): Payer: Self-pay

## 2017-08-10 ENCOUNTER — Ambulatory Visit (INDEPENDENT_AMBULATORY_CARE_PROVIDER_SITE_OTHER): Payer: 59 | Admitting: Family Medicine

## 2017-08-10 VITALS — BP 132/86 | HR 84 | Temp 98.4°F | Ht 63.0 in | Wt 161.0 lb

## 2017-08-10 DIAGNOSIS — L0100 Impetigo, unspecified: Secondary | ICD-10-CM

## 2017-08-10 DIAGNOSIS — M069 Rheumatoid arthritis, unspecified: Secondary | ICD-10-CM

## 2017-08-10 DIAGNOSIS — K219 Gastro-esophageal reflux disease without esophagitis: Secondary | ICD-10-CM | POA: Diagnosis not present

## 2017-08-10 DIAGNOSIS — Z7689 Persons encountering health services in other specified circumstances: Secondary | ICD-10-CM

## 2017-08-10 MED ORDER — MUPIROCIN 2 % EX OINT: 1.0000 "application " | TOPICAL_OINTMENT | Freq: Two times a day (BID) | CUTANEOUS | 0 refills | Status: AC

## 2017-08-10 MED FILL — MUPIROCIN 2% OINTMENT: 2 | 5 days supply | Qty: 22 | Fill #0

## 2017-08-10 NOTE — Patient Instructions (Addendum)
Impetigo, Adult Impetigo is an infection of the skin. It commonly occurs in young children, but it can also occur in adults. The infection causes itchy blisters and sores that produce brownish-yellow fluid. As the fluid dries, it forms a thick, honey-colored crust. These skin changes usually occur on the face but can also affect other areas of the body. Impetigo usually goes away in 7-10 days with treatment. What are the causes? Impetigo is caused by two types of bacteria. It may be caused by staphylococci or streptococci bacteria. These bacteria cause impetigo when they get under the surface of the skin. This often happens after some damage to the skin, such as damage from:  Cuts, scrapes, or scratches.  Insect bites, especially when you scratch the area of a bite.  Chickenpox or other illnesses that cause open skin sores.  Nail biting or chewing.  Impetigo is contagious and can spread easily from one person to another. This may occur through close skin contact or by sharing towels, clothing, or other items with a person who has the infection. What increases the risk? Some things that can increase the risk of getting this infection include:  Playing sports that include skin-to-skin contact with others.  Having a skin condition with open sores.  Having many skin cuts or scrapes.  Living in an area that has high humidity levels.  Having poor hygiene.  Having high levels of staphylococci in your nose.  What are the signs or symptoms? Impetigo usually starts out as small blisters, often on the face. The blisters then break open and turn into tiny sores (lesions) with a yellow crust. In some cases, the blisters cause itching or burning. With scratching, irritation, or lack of treatment, these small lesions may get larger. Scratching can also cause impetigo to spread to other parts of the body. The bacteria can get under the fingernails and spread when you touch another area of your  skin. Other possible symptoms include:  Larger blisters.  Pus.  Swollen lymph glands.  How is this diagnosed? This condition is usually diagnosed during a physical exam. A skin sample or sample of fluid from a blister may be taken for lab tests that involve growing bacteria (culture test). This can help confirm the diagnosis or help determine the best treatment. How is this treated? Mild impetigo can be treated with prescription antibiotic cream. Oral antibiotic medicine may be used in more severe cases. Medicines for itching may also be used. Follow these instructions at home:  Take medicines only as directed by your health care provider.  To help prevent impetigo from spreading to other body areas: ? Keep your fingernails short and clean. ? Do not scratch the blisters or sores. ? Cover infected areas, if necessary, to keep from scratching.  Gently wash the infected areas with antibiotic soap and water.  Soak crusted areas in warm, soapy water using antibiotic soap. ? Gently rub the areas to remove crusts. Do not scrub.  Wash your hands often to avoid spreading this infection.  Stay home until you have used an antibiotic cream for 48 hours (2 days) or an oral antibiotic medicine for 24 hours (1 day). You should only return to work and activities with other people if your skin shows significant improvement. How is this prevented? To keep the infection from spreading:  Stay home until you have used an antibiotic cream for 48 hours or an oral antibiotic for 24 hours.  Wash your hands often.  Do not engage in   skin-to-skin contact with other people while you have still have blisters.  Do not share towels, washcloths, or bedding with others while you have the infection.  Contact a health care provider if:  You develop more blisters or sores despite treatment.  Other family members get sores.  Your skin sores are not improving after 48 hours of treatment.  You have a  fever. Get help right away if:  You see spreading redness or swelling of the skin around your sores.  You see red streaks coming from your sores.  You develop a sore throat. This information is not intended to replace advice given to you by your health care provider. Make sure you discuss any questions you have with your health care provider. Document Released: 06/13/2014 Document Revised: 10/29/2015 Document Reviewed: 05/06/2014 Elsevier Interactive Patient Education  2017 [employer]  Gastroesophageal Reflux Disease, Adult Normally, food travels down the esophagus and stays in the stomach to be digested. However, when a person has gastroesophageal reflux disease (GERD), food and stomach acid move back up into the esophagus. When this happens, the esophagus becomes sore and inflamed. Over time, GERD can create small holes (ulcers) in the lining of the esophagus. What are the causes? This condition is caused by a problem with the muscle between the esophagus and the stomach (lower esophageal sphincter, or LES). Normally, the LES muscle closes after food passes through the esophagus to the stomach. When the LES is weakened or abnormal, it does not close properly, and that allows food and stomach acid to go back up into the esophagus. The LES can be weakened by certain dietary substances, medicines, and medical conditions, including:  Tobacco use.  Pregnancy.  Having a hiatal hernia.  Heavy alcohol use.  Certain foods and beverages, such as coffee, chocolate, onions, and peppermint.  What increases the risk? This condition is more likely to develop in:  People who have an increased body weight.  People who have connective tissue disorders.  People who use NSAID medicines.  What are the signs or symptoms? Symptoms of this condition include:  Heartburn.  Difficult or painful swallowing.  The feeling of having a lump in the throat.  Abitter taste in the mouth.  Bad  breath.  Having a large amount of saliva.  Having an upset or bloated stomach.  Belching.  Chest pain.  Shortness of breath or wheezing.  Ongoing (chronic) cough or a night-time cough.  Wearing away of tooth enamel.  Weight loss.  Different conditions can cause chest pain. Make sure to see your health care provider if you experience chest pain. How is this diagnosed? Your health care provider will take a medical history and perform a physical exam. To determine if you have mild or severe GERD, your health care provider may also monitor how you respond to treatment. You may also have other tests, including:  An endoscopy toexamine your stomach and esophagus with a small camera.  A test thatmeasures the acidity level in your esophagus.  A test thatmeasures how much pressure is on your esophagus.  A barium swallow or modified barium swallow to show the shape, size, and functioning of your esophagus.  How is this treated? The goal of treatment is to help relieve your symptoms and to prevent complications. Treatment for this condition may vary depending on how severe your symptoms are. Your health care provider may recommend:  Changes to your diet.  Medicine.  Surgery.  Follow these instructions at home: Diet  Follow a  diet as recommended by your health care provider. This may involve avoiding foods and drinks such as: ? Coffee and tea (with or without caffeine). ? Drinks that containalcohol. ? Energy drinks and sports drinks. ? Carbonated drinks or sodas. ? Chocolate and cocoa. ? Peppermint and mint flavorings. ? Garlic and onions. ? Horseradish. ? Spicy and acidic foods, including peppers, chili powder, curry powder, vinegar, hot sauces, and barbecue sauce. ? Citrus fruit juices and citrus fruits, such as oranges, lemons, and limes. ? Tomato-based foods, such as red sauce, chili, salsa, and pizza with red sauce. ? Fried and fatty foods, such as donuts, french  fries, potato chips, and high-fat dressings. ? High-fat meats, such as hot dogs and fatty cuts of red and white meats, such as rib eye steak, sausage, ham, and bacon. ? High-fat dairy items, such as whole milk, butter, and cream cheese.  Eat small, frequent meals instead of large meals.  Avoid drinking large amounts of liquid with your meals.  Avoid eating meals during the 2-3 hours before bedtime.  Avoid lying down right after you eat.  Do not exercise right after you eat. General instructions  Pay attention to any changes in your symptoms.  Take over-the-counter and prescription medicines only as told by your health care provider. Do not take aspirin, ibuprofen, or other NSAIDs unless your health care provider told you to do so.  Do not use any tobacco products, including cigarettes, chewing tobacco, and e-cigarettes. If you need help quitting, ask your health care provider.  Wear loose-fitting clothing. Do not wear anything tight around your waist that causes pressure on your abdomen.  Raise (elevate) the head of your bed 6 inches (15cm).  Try to reduce your stress, such as with yoga or meditation. If you need help reducing stress, ask your health care provider.  If you are overweight, reduce your weight to an amount that is healthy for you. Ask your health care provider for guidance about a safe weight loss goal.  Keep all follow-up visits as told by your health care provider. This is important. Contact a health care provider if:  You have new symptoms.  You have unexplained weight loss.  You have difficulty swallowing, or it hurts to swallow.  You have wheezing or a persistent cough.  Your symptoms do not improve with treatment.  You have a hoarse voice. Get help right away if:  You have pain in your arms, neck, jaw, teeth, or back.  You feel sweaty, dizzy, or light-headed.  You have chest pain or shortness of breath.  You vomit and your vomit looks like blood  or coffee grounds.  You faint.  Your stool is bloody or black.  You cannot swallow, drink, or eat. This information is not intended to replace advice given to you by your health care provider. Make sure you discuss any questions you have with your health care provider. Document Released: 03/02/2005 Document Revised: 10/21/2015 Document Reviewed: 09/17/2014 Elsevier Interactive Patient Education  2018 Elsevier Inc.  Rheumatoid Arthritis Rheumatoid arthritis (RA) is a long-term (chronic) disease that causes inflammation in your joints. RA may start slowly. It usually affects the small joints of the hands and feet. Usually, the same joints are affected on both sides of your body. Inflammation from RA can also affect other parts of your body, including your heart, eyes, or lungs. RA is an autoimmune disease. That means that your body's defense system (immune system) mistakenly attacks healthy body tissues. There is no cure  for RA, but medicines can help your symptoms and halt or slow down the progression of the disease. What are the causes? The exact cause of RA is not known. What increases the risk? This condition is more likely to develop in:  Women.  People who have a family history of RA or other autoimmune diseases.  What are the signs or symptoms? Symptoms of this condition vary from person to person. Symptoms usually start gradually. They are often worse in the morning. The first symptom may be morning stiffness that lasts longer than 30 minutes. As RA progresses, symptoms may include:  Pain, stiffness, swelling, warmth, and tenderness in joints on both sides of your body.  Loss of energy.  Loss of appetite.  Weight loss.  Low-grade fever.  Dry eyes and dry mouth.  Firm lumps (rheumatoid nodules) that grow beneath your skin in areas such as your forearm bones near your elbows and on your hands.  Changes in the appearance of joints (deformity) and loss of joint  function.  Symptoms of RA often come and go. Sometimes, symptoms get worse for a period of time. These are called flares. How is this diagnosed? This condition is diagnosed based on your symptoms, medical history, and physical exam. You may have X-rays or MRI to check for the type of joint changes that are caused by RA. You may also have blood tests to look for:  Proteins (antibodies) that your immune system may make if you have RA. They include rheumatoid factor (RF) and anti-CCP. ? When blood tests show these proteins, you are said to have "seropositive RA." ? When blood tests do not show these proteins, you may have "seronegative RA."  Inflammation in your blood.  A low number of red blood cells (anemia).  How is this treated? The goals of treatment are to relieve pain, reduce inflammation, and slow down or stop joint damage and disability. Treatment may include:  Lifestyle changes. It is important to rest, eat a healthy diet, and exercise.  Medicines. Your health care provider may adjust your medicines every 3 months until treatment goals are reached. Common medicines include: ? Pain relievers (analgesics). ? Corticosteroids and NSAIDs to reduce inflammation. ? Disease-modifying antirheumatic drugs (DMARDs) to try to slow the course of the disease. ? Biologic response modifiers to reduce inflammation and damage.  Physical therapy and occupational therapy.  Surgery, if you have severe joint damage. Joint replacement or fusing of joints may be needed.  Your health care provider will work with you to identify the best treatment option for you based on assessment of the overall disease activity in your body. Follow these instructions at home:  Take over-the-counter and prescription medicines only as told by your health care provider.  Start an exercise program as told by your health care provider.  Rest when you are having a flare.  Return to your normal activities as told by  your health care provider. Ask your health care provider what activities are safe for you.  Keep all follow-up visits as told by your health care provider. This is important. Where to find more information:  [employer]: www.rheumatology.org  Arthritis Foundation: www.arthritis.org Contact a health care provider if:  You have a flare-up of RA symptoms.  You have a fever.  You have side effects from your medicines. Get help right away if:  You have chest pain.  You have trouble breathing.  You quickly develop a hot, painful joint that is more severe than  your usual joint aches. This information is not intended to replace advice given to you by your health care provider. Make sure you discuss any questions you have with your health care provider. Document Released: 05/20/2000 Document Revised: 10/27/2015 Document Reviewed: 03/05/2015 Elsevier Interactive Patient Education  [employer].

## 2017-08-10 NOTE — Progress Notes (Signed)
Patient presents to clinic today to establish care.  SUBJECTIVE: PMH: Patient is a 47 year old female with past medical history significant for rheumatoid arthritis, GERD.  Patient has not been seen in several years.  RA: -d/x'd 5-6 years ago -Patient is followed by rheumatology -At this moment patient is a part of a drug research study.  The study is scheduled to end at the end of March 2019 -Patient is having issues with ongoing daily pain.  She takes naproxen twice daily for this -Patient is also taking methotrexate injection, folic acid for her RA.  GERD: -Patient endorses increasing reflux symptoms with most foods -Patient does endorse twice daily naproxen use for pain associated with RA. -Patient has been using naproxen for a while  Sore on nose: -Patient endorses a sore on her nose that has been coming and going but not completely resolving. -The area is nonpruritic -Pt denies having a pimple in the area.  Allergies: NKDA  Past surgical history: None  Social history: Patient is married.  She does not have any children.  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  Patient denies alcohol, tobacco or drug use.  Patient is trying to be a vegetarian.  She does endorse occasionally eating chicken.  Patient endorses taking a daily multivitamin.  LMP 07/22/17.  Patient endorses irregular menses.  Was having heavy bleeding, but has since become lighter and not every month.  Patient is unsure when her mother went through menopause..  Patient denies being pregnant.  Family medical history: Mom-deceased, stroke Dad-deceased  Health Maintenance: Mammogram --needs to schedule PAP --needs to schedule   Past Medical History:  Diagnosis Date  . Arthritis    Rheumatoid dx 2010 on Methotrexate  . GERD (gastroesophageal reflux disease)     History reviewed. No pertinent surgical history.  Current Outpatient Medications on File Prior to Visit  Medication Sig  Dispense Refill  . folic acid (FOLVITE) 1 MG tablet Take 1 mg by mouth daily.    . methotrexate 1 G injection Inject into the vein once.    . naproxen (NAPROSYN) 500 MG tablet Take 500 mg by mouth 2 (two) times daily with a meal.    . predniSONE (DELTASONE) 5 MG tablet Take 5 mg by mouth daily with breakfast.     No current facility-administered medications on file prior to visit.     No Known Allergies  Family History  Problem Relation Age of Onset  . Heart disease Mother     Social History   Socioeconomic History  . Marital status: Married    Spouse name: Not on file  . Number of children: Not on file  . Years of education: Not on file  . Highest education level: Not on file  Social Needs  . Financial resource strain: Not on file  . Food insecurity - worry: Not on file  . Food insecurity - inability: Not on file  . Transportation needs - medical: Not on file  . Transportation needs - non-medical: Not on file  Occupational History  . Not on file  Tobacco Use  . Smoking status: Never Smoker  . Smokeless tobacco: Never Used  Substance and Sexual Activity  . Alcohol use: No  . Drug use: No  . Sexual activity: Yes  Other Topics Concern  . Not on file  Social History Narrative  . Not on file    ROS General: Denies fever, chills, night sweats, changes in weight, changes in appetite  HEENT: Denies headaches, ear pain, changes in vision, rhinorrhea, sore throat CV: Denies CP, palpitations, SOB, orthopnea Pulm: Denies SOB, cough, wheezing GI: Denies abdominal pain, nausea, vomiting, diarrhea, constipation  + reflux GU: Denies dysuria, hematuria, frequency, vaginal discharge  + heavy menses at times irregular Msk: Denies muscle cramps +joint pains 2/2 RA Neuro: Denies weakness, numbness, tingling Skin: Denies bruising  +rash, red spots on nose Psych: Denies depression, anxiety, hallucinations  BP 132/86 (BP Location: Right Arm, Patient Position: Sitting, Cuff Size:  Normal)   Pulse 84   Temp 98.4 F (36.9 C) (Oral)   Ht 5\' 3"  (1.6 m)   Wt 161 lb (73 kg)   LMP 07/22/2017 (Approximate)   SpO2 97%   BMI 28.52 kg/m   Physical Exam Gen. Pleasant, well developed, well-nourished, in NAD HEENT - [place]/AT, PERRL, no scleral icterus, L nares with small area of erythema no crusting present.  No nasal drainage, pharynx without erythema or exudate. Lungs: no use of accessory muscles, CTAB, no wheezes, rales or rhonchi Cardiovascular: RRR, no r/g/m, no peripheral edema Abdomen: BS present, soft, nontender,nondistended, no hepatosplenomegaly Neuro:  A&Ox3, CN II-XII intact, normal gait Skin:  Warm, dry, intact.  Small area of erythema on left nares, no crusting present.  Small hyperpigmented dry lesion on left breast.  Similar lesions on right thigh. Psych: normal affect, mood appropriate   No results found for this or any previous visit (from the past 2160 hour(s)).  Assessment/Plan: Rheumatoid arthritis involving multiple sites, unspecified rheumatoid factor presence (HCC) -Continue current medications including methotrexate injection, folic acid -Patient encouraged to try to decrease the use of naproxen daily given recent issues with reflux as likely causing gastritis. -Continue follow-up with rheumatology and with drug study.  Gastroesophageal reflux disease, esophagitis presence not specified -Given current use of methotrexate PPIs likely to cause a drug drug interaction. -We will refer to gastroenterology for increasing symptoms.  Consider EGD. - Plan: Ambulatory referral to Gastroenterology  Impetigo -Given handout - Plan: mupirocin ointment (BACTROBAN) 2 %  Encounter to establish care -We reviewed the PMH, PSH, FH, SH, Meds and Allergies. -We provided refills for any medications we will prescribe as needed. -We addressed current concerns per orders and patient instructions. -We have asked for records for pertinent exams, studies, vaccines and  notes from previous providers. -We have advised patient to follow up per instructions below.  Follow-up in the next few months for CPE.  Sooner if needed  2161, MD

## 2017-08-14 ENCOUNTER — Other Ambulatory Visit: Payer: Self-pay

## 2017-08-14 DIAGNOSIS — Z1231 Encounter for screening mammogram for malignant neoplasm of breast: Secondary | ICD-10-CM

## 2017-08-30 ENCOUNTER — Ambulatory Visit
Admission: RE | Admit: 2017-08-30 | Discharge: 2017-08-30 | Disposition: A | Discharge status: Home / Self Care | Payer: 59 | Source: Ambulatory Visit

## 2017-08-30 DIAGNOSIS — Z1231 Encounter for screening mammogram for malignant neoplasm of breast: Secondary | ICD-10-CM

## 2017-09-11 ENCOUNTER — Ambulatory Visit (INDEPENDENT_AMBULATORY_CARE_PROVIDER_SITE_OTHER): Payer: 59 | Admitting: Family Medicine

## 2017-09-11 ENCOUNTER — Encounter: Payer: Self-pay | Admitting: Family Medicine

## 2017-09-11 VITALS — BP 142/62 | HR 86 | Temp 98.2°F | Ht 63.0 in | Wt 162.0 lb

## 2017-09-11 DIAGNOSIS — Z13220 Encounter for screening for lipoid disorders: Secondary | ICD-10-CM | POA: Diagnosis not present

## 2017-09-11 DIAGNOSIS — Z131 Encounter for screening for diabetes mellitus: Secondary | ICD-10-CM | POA: Diagnosis not present

## 2017-09-11 DIAGNOSIS — Z0000 Encounter for general adult medical examination without abnormal findings: Secondary | ICD-10-CM | POA: Diagnosis not present

## 2017-09-11 DIAGNOSIS — L0100 Impetigo, unspecified: Secondary | ICD-10-CM

## 2017-09-11 LAB — CBC WITH DIFFERENTIAL/PLATELET
BASOS PCT: 0.7 % (ref 0.0–3.0)
Basophils Absolute: 0 10*3/uL (ref 0.0–0.1)
EOS ABS: 0.1 10*3/uL (ref 0.0–0.7)
Eosinophils Relative: 1.6 % (ref 0.0–5.0)
HEMATOCRIT: 40.6 % (ref 36.0–46.0)
Hemoglobin: 13.7 g/dL (ref 12.0–15.0)
LYMPHS ABS: 1.7 10*3/uL (ref 0.7–4.0)
Lymphocytes Relative: 24.9 % (ref 12.0–46.0)
MCHC: 33.8 g/dL (ref 30.0–36.0)
MCV: 83.1 fl (ref 78.0–100.0)
MONO ABS: 0.6 10*3/uL (ref 0.1–1.0)
Monocytes Relative: 9.1 % (ref 3.0–12.0)
NEUTROS ABS: 4.4 10*3/uL (ref 1.4–7.7)
Neutrophils Relative %: 63.7 % (ref 43.0–77.0)
PLATELETS: 345 10*3/uL (ref 150.0–400.0)
RBC: 4.89 Mil/uL (ref 3.87–5.11)
RDW: 16.4 % — AB (ref 11.5–15.5)
WBC: 6.9 10*3/uL (ref 4.0–10.5)

## 2017-09-11 LAB — HEMOGLOBIN A1C: Hgb A1c MFr Bld: 6.2 % (ref 4.6–6.5)

## 2017-09-11 LAB — BASIC METABOLIC PANEL
BUN: 12 mg/dL (ref 6–23)
CHLORIDE: 106 meq/L (ref 96–112)
CO2: 25 meq/L (ref 19–32)
Calcium: 9.1 mg/dL (ref 8.4–10.5)
Creatinine, Ser: 0.89 mg/dL (ref 0.40–1.20)
GFR: 72.22 mL/min (ref >60.00)
Glucose, Bld: 99 mg/dL (ref 70–99)
Potassium: 4.3 mEq/L (ref 3.5–5.1)
SODIUM: 139 meq/L (ref 135–145)

## 2017-09-11 LAB — LIPID PANEL
CHOLESTEROL: 103 mg/dL (ref 0–200)
HDL: 31.9 mg/dL — AB (ref >39.00)
LDL Cholesterol: 50 mg/dL (ref 0–99)
NonHDL: 71.12
TRIGLYCERIDES: 108 mg/dL (ref 0.0–149.0)
Total CHOL/HDL Ratio: 3
VLDL: 21.6 mg/dL (ref 0.0–40.0)

## 2017-09-11 NOTE — Patient Instructions (Signed)
Preventive Care 40-64 Years, Female Preventive care refers to lifestyle choices and visits with your health care provider that can promote health and wellness. What does preventive care include?  A yearly physical exam. This is also called an annual well check.  Dental exams once or twice a year.  Routine eye exams. Ask your health care provider how often you should have your eyes checked.  Personal lifestyle choices, including: ? Daily care of your teeth and gums. ? Regular physical activity. ? Eating a healthy diet. ? Avoiding tobacco and drug use. ? Limiting alcohol use. ? Practicing safe sex. ? Taking low-dose aspirin daily starting at age 58. ? Taking vitamin and mineral supplements as recommended by your health care provider. What happens during an annual well check? The services and screenings done by your health care provider during your annual well check will depend on your age, overall health, lifestyle risk factors, and family history of disease. Counseling Your health care provider may ask you questions about your:  Alcohol use.  Tobacco use.  Drug use.  Emotional well-being.  Home and relationship well-being.  Sexual activity.  Eating habits.  Work and work Statistician.  Method of birth control.  Menstrual cycle.  Pregnancy history.  Screening You may have the following tests or measurements:  Height, weight, and BMI.  Blood pressure.  Lipid and cholesterol levels. These may be checked every 5 years, or more frequently if you are over 81 years old.  Skin check.  Lung cancer screening. You may have this screening every year starting at age 78 if you have a 30-pack-year history of smoking and currently smoke or have quit within the past 15 years.  Fecal occult blood test (FOBT) of the stool. You may have this test every year starting at age 65.  Flexible sigmoidoscopy or colonoscopy. You may have a sigmoidoscopy every 5 years or a colonoscopy  every 10 years starting at age 30.  Hepatitis C blood test.  Hepatitis B blood test.  Sexually transmitted disease (STD) testing.  Diabetes screening. This is done by checking your blood sugar (glucose) after you have not eaten for a while (fasting). You may have this done every 1-3 years.  Mammogram. This may be done every 1-2 years. Talk to your health care provider about when you should start having regular mammograms. This may depend on whether you have a family history of breast cancer.  BRCA-related cancer screening. This may be done if you have a family history of breast, ovarian, tubal, or peritoneal cancers.  Pelvic exam and Pap test. This may be done every 3 years starting at age 80. Starting at age 36, this may be done every 5 years if you have a Pap test in combination with an HPV test.  Bone density scan. This is done to screen for osteoporosis. You may have this scan if you are at high risk for osteoporosis.  Discuss your test results, treatment options, and if necessary, the need for more tests with your health care provider. Vaccines Your health care provider may recommend certain vaccines, such as:  Influenza vaccine. This is recommended every year.  Tetanus, diphtheria, and acellular pertussis (Tdap, Td) vaccine. You may need a Td booster every 10 years.  Varicella vaccine. You may need this if you have not been vaccinated.  Zoster vaccine. You may need this after age 5.  Measles, mumps, and rubella (MMR) vaccine. You may need at least one dose of MMR if you were born in  1957 or later. You may also need a second dose.  Pneumococcal 13-valent conjugate (PCV13) vaccine. You may need this if you have certain conditions and were not previously vaccinated.  Pneumococcal polysaccharide (PPSV23) vaccine. You may need one or two doses if you smoke cigarettes or if you have certain conditions.  Meningococcal vaccine. You may need this if you have certain  conditions.  Hepatitis A vaccine. You may need this if you have certain conditions or if you travel or work in places where you may be exposed to hepatitis A.  Hepatitis B vaccine. You may need this if you have certain conditions or if you travel or work in places where you may be exposed to hepatitis B.  Haemophilus influenzae type b (Hib) vaccine. You may need this if you have certain conditions.  Talk to your health care provider about which screenings and vaccines you need and how often you need them. This information is not intended to replace advice given to you by your health care provider. Make sure you discuss any questions you have with your health care provider. Document Released: 06/19/2015 Document Revised: 02/10/2016 Document Reviewed: 03/24/2015 Elsevier Interactive Patient Education  2018 Elsevier Inc.  

## 2017-09-11 NOTE — Progress Notes (Signed)
Subjective:     Lisa Chase is a 47 y.o. female and is here for a comprehensive physical exam. The patient reports problems - small bump on nose still remaining.  Pt using mupirocin ointment bid, instead of TID.  Pt states the area becomes dry and has improved some.  Pt is not able to exercise as much as she wants 2/2 RA.  Pt may walk around her apt building x 20 min most days.  Pt at times goes to the gym, but is only able to do about 15 min of activity before her joints start hurting.    Pt had a recent mammogram.  Pt cannot recall her last pap, but does not want to have one done this visit.  Social History   Socioeconomic History  . Marital status: Married    Spouse name: Not on file  . Number of children: Not on file  . Years of education: Not on file  . Highest education level: Not on file  Occupational History  . Not on file  Social Needs  . Financial resource strain: Not on file  . Food insecurity:    Worry: Not on file    Inability: Not on file  . Transportation needs:    Medical: Not on file    Non-medical: Not on file  Tobacco Use  . Smoking status: Never Smoker  . Smokeless tobacco: Never Used  Substance and Sexual Activity  . Alcohol use: No  . Drug use: No  . Sexual activity: Yes  Lifestyle  . Physical activity:    Days per week: Not on file    Minutes per session: Not on file  . Stress: Not on file  Relationships  . Social connections:    Talks on phone: Not on file    Gets together: Not on file    Attends religious service: Not on file    Active member of club or organization: Not on file    Attends meetings of clubs or organizations: Not on file    Relationship status: Not on file  . Intimate partner violence:    Fear of current or ex partner: Not on file    Emotionally abused: Not on file    Physically abused: Not on file    Forced sexual activity: Not on file  Other Topics Concern  . Not on file  Social History Narrative  . Not on  file   Health Maintenance  Topic Date Due  . HIV Screening  07/23/1985  . TETANUS/TDAP  07/23/1989  . PAP SMEAR  07/24/1991  . INFLUENZA VACCINE  01/04/2018    The following portions of the patient's history were reviewed and updated as appropriate: allergies, current medications, past family history, past medical history, past social history, past surgical history and problem list.  Review of Systems A comprehensive review of systems was negative.   Objective:    BP (!) 142/62 (BP Location: Left Arm, Patient Position: Sitting, Cuff Size: Normal)   Pulse 86   Temp 98.2 F (36.8 C) (Oral)   Ht 5\' 3"  (1.6 m)   Wt 162 lb (73.5 kg)   SpO2 96%   BMI 28.70 kg/m  General appearance: alert, cooperative, appears stated age and no distress Head: Normocephalic, without obvious abnormality, atraumatic Eyes: conjunctivae/corneas clear. PERRL, EOM's intact. Fundi benign. Ears: normal TM's and external ear canals both ears Nose: Nares normal. Septum midline. Mucosa normal. No drainage or sinus tenderness. Throat: lips, mucosa, and tongue normal; teeth  and gums normal Neck: no adenopathy, no carotid bruit, no JVD, supple, symmetrical, trachea midline and thyroid not enlarged, symmetric, no tenderness/mass/nodules Lungs: clear to auscultation bilaterally Breasts: normal appearance, no masses or tenderness, normal in appearance.  Further exam declined by pt, just had mammogram. Heart: regular rate and rhythm, S1, S2 normal, no murmur, click, rub or gallop Abdomen: soft, non-tender; bowel sounds normal; no masses,  no organomegaly Pelvic: exam declined by pt. Skin: Skin color, texture, turgor normal. No rashes.   Small 1 mm area of erythema on medial L nares, no pustule present. Neurologic: Alert and oriented X 3, normal strength and tone. Normal symmetric reflexes. Normal coordination and gait    Assessment:    Healthy female exam.      Plan:      Anticipatory guidance given including  wearing seatbelts, increasing physical activity, smoke detectors in the home, increasing p.o. intake of water -Patient encouraged to consider warm water aerobics for exercise. -We will obtain labs this visit -Mammogram up-to-date, done 08/2017 -Patient declines Pap at this time.  Continue to offer at each visit See After Visit Summary for Counseling Recommendations    Impetigo -Continue using Mupirocin ointment TID -discussed frequent handwashing.  Follow-up PRN  Abbe Amsterdam, MD

## 2017-09-25 MED FILL — NAPROXEN 500 MG TABLET: 500 | 30 days supply | Qty: 60 | Fill #0

## 2017-10-04 ENCOUNTER — Telehealth: Payer: Self-pay | Admitting: Family Medicine

## 2017-10-04 NOTE — Telephone Encounter (Signed)
Copied from CRM [id number]. Topic: Quick Communication - See Telephone Encounter >> Oct 04, 2017 10:43 AM Clack, Princella Pellegrini wrote: CRM for notification. See Telephone encounter for: 10/04/17.  Pt would like a nurse to give her a call about her lab results.

## 2017-10-05 NOTE — Telephone Encounter (Signed)
Pt notified of results/instructions and verbalized understanding. She states she has changed to vegetarian diet and is trying to exercise more.  Released Dr. Salomon Fick' comments to patient's MyChart.

## 2017-10-11 ENCOUNTER — Encounter: Payer: Self-pay | Admitting: Family Medicine

## 2017-10-25 ENCOUNTER — Other Ambulatory Visit: Payer: Self-pay | Admitting: Internal Medicine

## 2017-10-25 DIAGNOSIS — M0589 Other rheumatoid arthritis with rheumatoid factor of multiple sites: Secondary | ICD-10-CM | POA: Diagnosis not present

## 2017-10-25 DIAGNOSIS — R7611 Nonspecific reaction to tuberculin skin test without active tuberculosis: Secondary | ICD-10-CM | POA: Diagnosis not present

## 2017-10-25 DIAGNOSIS — R5383 Other fatigue: Secondary | ICD-10-CM | POA: Diagnosis not present

## 2017-10-25 DIAGNOSIS — Z79899 Other long term (current) drug therapy: Secondary | ICD-10-CM | POA: Diagnosis not present

## 2017-10-25 MED ORDER — ETANERCEPT 50 MG/ML ~~LOC~~ SOAJ: 50.0000 mg | SUBCUTANEOUS | 3 refills | Status: DC

## 2017-10-25 MED FILL — traMADol HCL 50 MG TABS: 50 | 30 days supply | Qty: 60 | Fill #0

## 2017-10-31 ENCOUNTER — Encounter (INDEPENDENT_AMBULATORY_CARE_PROVIDER_SITE_OTHER): Payer: Self-pay

## 2017-11-14 ENCOUNTER — Ambulatory Visit (INDEPENDENT_AMBULATORY_CARE_PROVIDER_SITE_OTHER): Payer: 59 | Admitting: Internal Medicine

## 2017-11-14 VITALS — BP 140/61

## 2017-11-14 DIAGNOSIS — Z79899 Other long term (current) drug therapy: Secondary | ICD-10-CM

## 2017-11-14 MED ORDER — ETANERCEPT 50 MG/ML ~~LOC~~ SOAJ: 50.0000 mg | SUBCUTANEOUS | 3 refills | Status: DC

## 2017-11-14 NOTE — Patient Instructions (Signed)
Etanercept injection What is this medicine? ETANERCEPT (et a NER sept) is used for the treatment of rheumatoid arthritis in adults and children. The medicine is also used to treat psoriatic arthritis, ankylosing spondylitis, and psoriasis. This medicine may be used for other purposes; ask your health care provider or pharmacist if you have questions. COMMON BRAND NAME(S): Enbrel What should I tell my health care provider before I take this medicine? They need to know if you have any of these conditions: -blood disorders -cancer -congestive heart failure -diabetes -exposure to chickenpox -immune system problems -infection -multiple sclerosis -seizure disorder -tuberculosis, a positive skin test for tuberculosis or have recently been in close contact with someone who has tuberculosis -Wegener's granulomatosis -an unusual or allergic reaction to etanercept, latex, other medicines, foods, dyes, or preservatives -pregnant or trying to get pregnant -breast-feeding How should I use this medicine? The medicine is given by injection under the skin. You will be taught how to prepare and give this medicine. Use exactly as directed. Take your medicine at regular intervals. Do not take your medicine more often than directed. It is important that you put your used needles and syringes in a special sharps container. Do not put them in a trash can. If you do not have a sharps container, call your pharmacist or healthcare provider to get one. A special MedGuide will be given to you by the pharmacist with each prescription and refill. Be sure to read this information carefully each time. Talk to your pediatrician regarding the use of this medicine in children. While this drug may be prescribed for children as young as 4 years of age for selected conditions, precautions do apply. Overdosage: If you think you have taken too much of this medicine contact a poison control center or emergency room at once. NOTE:  This medicine is only for you. Do not share this medicine with others. What if I miss a dose? If you miss a dose, contact your health care professional to find out when you should take your next dose. Do not take double or extra doses without advice. What may interact with this medicine? Do not take this medicine with any of the following medications: -anakinra This medicine may also interact with the following medications: -cyclophosphamide -sulfasalazine -vaccines This list may not describe all possible interactions. Give your health care provider a list of all the medicines, herbs, non-prescription drugs, or dietary supplements you use. Also tell them if you smoke, drink alcohol, or use illegal drugs. Some items may interact with your medicine. What should I watch for while using this medicine? Tell your doctor or healthcare professional if your symptoms do not start to get better or if they get worse. You will be tested for tuberculosis (TB) before you start this medicine. If your doctor prescribes any medicine for TB, you should start taking the TB medicine before starting this medicine. Make sure to finish the full course of TB medicine. Call your doctor or health care professional for advice if you get a fever, chills or sore throat, or other symptoms of a cold or flu. Do not treat yourself. This drug decreases your body's ability to fight infections. Try to avoid being around people who are sick. What side effects may I notice from receiving this medicine? Side effects that you should report to your doctor or health care professional as soon as possible: -allergic reactions like skin rash, itching or hives, swelling of the face, lips, or tongue -changes in vision -fever, chills   or any other sign of infection -numbness or tingling in legs or other parts of the body -red, scaly patches or raised bumps on the skin -shortness of breath or difficulty breathing -swollen lymph nodes in the  neck, underarm, or groin areas -unexplained weight loss -unusual bleeding or bruising -unusual swelling or fluid retention in the legs -unusually weak or tired Side effects that usually do not require medical attention (report to your doctor or health care professional if they continue or are bothersome): -dizziness -headache -nausea -redness, itching, or swelling at the injection site -vomiting This list may not describe all possible side effects. Call your doctor for medical advice about side effects. You may report side effects to FDA at 1-800-FDA-1088. Where should I keep my medicine? Keep out of the reach of children. Store between 2 and 8 degrees C (36 and 46 degrees F). Do not freeze or shake. Protect from light. Throw away any unused medicine after the expiration date. You will be instructed on how to store this medicine. NOTE: This sheet is a summary. It may not cover all possible information. If you have questions about this medicine, talk to your doctor, pharmacist, or health care provider.  2018 Elsevier/Gold Standard (2011-11-28 15:33:36)  

## 2017-11-14 NOTE — Progress Notes (Signed)
  S: [facility] therapy.  Patient is currently taking Enbrel for Rheumatoid Arthritis. Patient is managed by Dr. Salomon Fick for this.   Dosing:  Ankylosing spondylitis, psoriatic arthritis, rheumatoid arthritis: SubQ: Note: May continue methotrexate, glucocorticoids, salicylates, NSAIDs, or analgesics during etanercept therapy. Once-weekly dosing: 50 mg once weekly; maximum dose (rheumatoid arthritis): 50 mg/week. 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), treatment (off-label use): SubQ: 0.4 mg/kg (maximum: 25 mg/dose) twice weekly for 8 weeks (in combination with methylprednisolone) Lenis Noon 2008)  Monitoring: Injection site reactions: S/sx of infections: S/sx of malignancy: GI upset:  O: BP: 140/61   Lab Results  Component Value Date   WBC 6.9 09/11/2017   HGB 13.7 09/11/2017   HCT 40.6 09/11/2017   MCV 83.1 09/11/2017   PLT 345.0 09/11/2017      Chemistry      Component Value Date/Time   NA 139 09/11/2017 0831   K 4.3 09/11/2017 0831   CL 106 09/11/2017 0831   CO2 25 09/11/2017 0831   BUN 12 09/11/2017 0831   CREATININE 0.89 09/11/2017 0831   CREATININE 0.92 08/22/2012 2053      Component Value Date/Time   CALCIUM 9.1 09/11/2017 0831   ALKPHOS 51 04/16/2014 1210   AST 109 (H) 04/16/2014 1210   ALT 82 (H) 04/16/2014 1210   BILITOT 0.3 04/16/2014 1210     A/P: 1. Medication review: patient currently on Enbrel for RA and is tolerating it. Reviewed the medication with the patient, including the following: Enbrel (etanercept) binds tumor necrosis factor (TNF) and blocks its interaction with cell  surface receptors. TNF plays an important role in the inflammatory processes of many diseases. Patient educated on purpose, proper use and potential adverse effects of Enbrel. Adverse effects include rash, GI upset, increased risk of infection, and injection site reactions. Patients should stop Enbrel if they develop a serious infection. There is a possible increased risk in lymphoma and other malignancies. No recommendations for any changes.    Jeanann Lewandowsky, MD

## 2017-11-30 DIAGNOSIS — M79643 Pain in unspecified hand: Secondary | ICD-10-CM | POA: Diagnosis not present

## 2017-11-30 DIAGNOSIS — M0589 Other rheumatoid arthritis with rheumatoid factor of multiple sites: Secondary | ICD-10-CM | POA: Diagnosis not present

## 2017-11-30 DIAGNOSIS — R7611 Nonspecific reaction to tuberculin skin test without active tuberculosis: Secondary | ICD-10-CM | POA: Diagnosis not present

## 2017-11-30 DIAGNOSIS — Z79899 Other long term (current) drug therapy: Secondary | ICD-10-CM | POA: Diagnosis not present

## 2017-11-30 DIAGNOSIS — R5383 Other fatigue: Secondary | ICD-10-CM | POA: Diagnosis not present

## 2017-11-30 MED FILL — ENBREL 50 MG/ML SURECLICK S: 50 | 28 days supply | Qty: 4 | Fill #0

## 2017-11-30 MED FILL — traMADol HCL 50 MG TABS: 50 | 14 days supply | Qty: 28 | Fill #0

## 2017-12-27 MED FILL — FOLIC ACID 1 MG TABS: 1 | 90 days supply | Qty: 90 | Fill #0

## 2017-12-27 MED FILL — ENBREL 50 MG/ML SURECLICK S: 50 | 28 days supply | Qty: 4 | Fill #1

## 2017-12-27 MED FILL — traMADol HCL 50 MG TABS: 50 | 14 days supply | Qty: 28 | Fill #1

## 2018-01-26 ENCOUNTER — Other Ambulatory Visit: Payer: Self-pay | Admitting: Pharmacist

## 2018-01-26 MED ORDER — ETANERCEPT 50 MG/ML ~~LOC~~ SOAJ: 50.0000 mg | SUBCUTANEOUS | 3 refills | Status: DC

## 2018-01-26 MED FILL — ENBREL 50 MG/ML SURECLICK S: 50 | 28 days supply | Qty: 4 | Fill #0

## 2018-03-01 MED FILL — ENBREL 50 MG/ML SURECLICK S: 50 | 28 days supply | Qty: 4 | Fill #1

## 2018-03-16 DIAGNOSIS — H11153 Pinguecula, bilateral: Secondary | ICD-10-CM | POA: Diagnosis not present

## 2018-03-16 DIAGNOSIS — H5213 Myopia, bilateral: Secondary | ICD-10-CM | POA: Diagnosis not present

## 2018-03-16 DIAGNOSIS — H524 Presbyopia: Secondary | ICD-10-CM | POA: Diagnosis not present

## 2018-03-16 DIAGNOSIS — H52223 Regular astigmatism, bilateral: Secondary | ICD-10-CM | POA: Diagnosis not present

## 2018-03-29 MED FILL — ENBREL SURECLICK 50 MG/ML S: 50 | 28 days supply | Qty: 4 | Fill #2

## 2018-04-16 ENCOUNTER — Other Ambulatory Visit: Payer: Self-pay | Admitting: Pharmacist

## 2018-04-16 DIAGNOSIS — M0589 Other rheumatoid arthritis with rheumatoid factor of multiple sites: Secondary | ICD-10-CM | POA: Diagnosis not present

## 2018-04-16 DIAGNOSIS — M79643 Pain in unspecified hand: Secondary | ICD-10-CM | POA: Diagnosis not present

## 2018-04-16 DIAGNOSIS — R7611 Nonspecific reaction to tuberculin skin test without active tuberculosis: Secondary | ICD-10-CM | POA: Diagnosis not present

## 2018-04-16 DIAGNOSIS — R5383 Other fatigue: Secondary | ICD-10-CM | POA: Diagnosis not present

## 2018-04-16 DIAGNOSIS — Z79899 Other long term (current) drug therapy: Secondary | ICD-10-CM | POA: Diagnosis not present

## 2018-04-16 MED ORDER — ETANERCEPT 50 MG/ML ~~LOC~~ SOAJ: 50.0000 mg | SUBCUTANEOUS | 3 refills | Status: DC

## 2018-04-16 MED FILL — predniSONE 5 MG TABS: 5 | 90 days supply | Qty: 90 | Fill #0

## 2018-04-16 MED FILL — traMADol HCL 50 MG TABS: 50 | 14 days supply | Qty: 28 | Fill #0

## 2018-04-16 MED FILL — FOLIC ACID 1 MG TABS: 1 | 90 days supply | Qty: 90 | Fill #0

## 2018-04-24 ENCOUNTER — Encounter: Payer: Self-pay | Admitting: Family Medicine

## 2018-04-24 ENCOUNTER — Ambulatory Visit: Payer: Self-pay | Admitting: Family Medicine

## 2018-04-24 VITALS — BP 130/82 | HR 66 | Temp 98.6°F | Wt 164.8 lb

## 2018-04-24 DIAGNOSIS — R05 Cough: Secondary | ICD-10-CM

## 2018-04-24 DIAGNOSIS — J069 Acute upper respiratory infection, unspecified: Secondary | ICD-10-CM

## 2018-04-24 DIAGNOSIS — R059 Cough, unspecified: Secondary | ICD-10-CM

## 2018-04-24 MED ORDER — IPRATROPIUM BROMIDE 0.03 % NA SOLN: 2.0000 | Freq: Two times a day (BID) | NASAL | 12 refills | Status: DC

## 2018-04-24 MED ORDER — ALBUTEROL SULFATE HFA 108 (90 BASE) MCG/ACT IN AERS: 2.0000 | INHALATION_SPRAY | Freq: Four times a day (QID) | RESPIRATORY_TRACT | 0 refills | Status: DC | PRN

## 2018-04-24 MED ORDER — BENZONATATE 200 MG PO CAPS: 200.0000 mg | ORAL_CAPSULE | Freq: Three times a day (TID) | ORAL | 0 refills | Status: DC | PRN

## 2018-04-24 MED ORDER — PSEUDOEPH-BROMPHEN-DM 30-2-10 MG/5ML PO SYRP: 10.0000 mL | ORAL_SOLUTION | Freq: Three times a day (TID) | ORAL | 0 refills | Status: DC | PRN

## 2018-04-24 NOTE — Progress Notes (Signed)
Lisa Chase is a 47 y.o. female who presents today with concerns of cough since last night. She reports that she used Tylenol cold and sinus with minimal relief and coughed through the night and all day at work. She reports some seasonal allergies and a history of the chronic condition RA that she takes medication for an is immunosuppressed.  Review of Systems  Constitutional: Negative for chills, fever and malaise/fatigue.  HENT: Negative for congestion, ear discharge, ear pain, sinus pain and sore throat.   Eyes: Negative.   Respiratory: Negative for cough, sputum production and shortness of breath.   Cardiovascular: Negative.  Negative for chest pain.  Gastrointestinal: Negative for abdominal pain, diarrhea, nausea and vomiting.  Genitourinary: Negative for dysuria, frequency, hematuria and urgency.  Musculoskeletal: Negative for myalgias.  Skin: Negative.   Neurological: Negative for headaches.  Endo/Heme/Allergies: Negative.   Psychiatric/Behavioral: Negative.     O: Vitals:   04/24/18 1713  BP: 130/82  Pulse: 66  Temp: 98.6 F (37 C)  SpO2: 97%     Physical Exam  Constitutional: She is oriented to person, place, and time. Vital signs are normal. She appears well-developed and well-nourished. She is active.  Non-toxic appearance. She does not have a sickly appearance.  HENT:  Head: Normocephalic.  Right Ear: Hearing, tympanic membrane, external ear and ear canal normal.  Left Ear: Hearing, tympanic membrane, external ear and ear canal normal.  Nose: Nose normal.  Mouth/Throat: Uvula is midline and oropharynx is clear and moist.  Neck: Normal range of motion. Neck supple.  Cardiovascular: Normal rate, regular rhythm, normal heart sounds and normal pulses.  Pulmonary/Chest: Effort normal and breath sounds normal.  Dry frequent cough on exam.  Abdominal: Soft. Bowel sounds are normal.  Musculoskeletal: Normal range of motion.  Lymphadenopathy:       Head  (right side): No submental and no submandibular adenopathy present.       Head (left side): No submental and no submandibular adenopathy present.    She has no cervical adenopathy.  Neurological: She is alert and oriented to person, place, and time.  Psychiatric: She has a normal mood and affect.  Vitals reviewed.    A: 1. Upper respiratory tract infection, unspecified type   2. Cough    P: Discussed exam findings, diagnosis etiology and medication use and indications reviewed with patient. Follow- Up and discharge instructions provided. No emergent/urgent issues found on exam.  Patient verbalized understanding of information provided and agrees with plan of care (POC), all questions answered.  1. Upper respiratory tract infection, unspecified type - ipratropium (ATROVENT) 0.03 % nasal spray; Place 2 sprays into both nostrils every 12 (twelve) hours. - albuterol (PROVENTIL HFA;VENTOLIN HFA) 108 (90 Base) MCG/ACT inhaler; Inhale 2 puffs into the lungs every 6 (six) hours as needed for wheezing or shortness of breath. - benzonatate (TESSALON) 200 MG capsule; Take 1 capsule (200 mg total) by mouth 3 (three) times daily as needed for cough. - brompheniramine-pseudoephedrine-DM 30-2-10 MG/5ML syrup; Take 10 mLs by mouth 3 (three) times daily as needed.  2. Cough - ipratropium (ATROVENT) 0.03 % nasal spray; Place 2 sprays into both nostrils every 12 (twelve) hours. - albuterol (PROVENTIL HFA;VENTOLIN HFA) 108 (90 Base) MCG/ACT inhaler; Inhale 2 puffs into the lungs every 6 (six) hours as needed for wheezing or shortness of breath. - benzonatate (TESSALON) 200 MG capsule; Take 1 capsule (200 mg total) by mouth 3 (three) times daily as needed for cough. - brompheniramine-pseudoephedrine-DM 30-2-10 MG/5ML syrup;  Take 10 mLs by mouth 3 (three) times daily as needed.

## 2018-04-24 NOTE — Patient Instructions (Signed)

## 2018-04-26 ENCOUNTER — Telehealth: Payer: Self-pay

## 2018-04-26 NOTE — Telephone Encounter (Signed)
I was no able to contacted the patient. 

## 2018-05-07 MED FILL — ENBREL SURECLICK 50 MG/ML S: 50 | 28 days supply | Qty: 4 | Fill #0

## 2018-05-16 ENCOUNTER — Encounter (HOSPITAL_COMMUNITY): Payer: Self-pay

## 2018-05-16 ENCOUNTER — Ambulatory Visit (INDEPENDENT_AMBULATORY_CARE_PROVIDER_SITE_OTHER): Payer: 59

## 2018-05-16 ENCOUNTER — Ambulatory Visit (HOSPITAL_COMMUNITY)
Admission: EM | Admit: 2018-05-16 | Discharge: 2018-05-16 | Disposition: A | Discharge status: Home / Self Care | Payer: 59 | Attending: Internal Medicine | Admitting: Internal Medicine

## 2018-05-16 DIAGNOSIS — J40 Bronchitis, not specified as acute or chronic: Secondary | ICD-10-CM | POA: Diagnosis not present

## 2018-05-16 DIAGNOSIS — R05 Cough: Secondary | ICD-10-CM

## 2018-05-16 DIAGNOSIS — R059 Cough, unspecified: Secondary | ICD-10-CM

## 2018-05-16 DIAGNOSIS — J069 Acute upper respiratory infection, unspecified: Secondary | ICD-10-CM | POA: Diagnosis not present

## 2018-05-16 DIAGNOSIS — M0589 Other rheumatoid arthritis with rheumatoid factor of multiple sites: Secondary | ICD-10-CM | POA: Diagnosis not present

## 2018-05-16 MED ORDER — AZITHROMYCIN 250 MG PO TABS: ORAL_TABLET | ORAL | 0 refills | Status: DC

## 2018-05-16 MED ORDER — IPRATROPIUM-ALBUTEROL 0.5-2.5 (3) MG/3ML IN SOLN
RESPIRATORY_TRACT | Status: AC
  Filled 2018-05-16: qty 3

## 2018-05-16 MED ORDER — ALBUTEROL SULFATE HFA 108 (90 BASE) MCG/ACT IN AERS: 2.0000 | INHALATION_SPRAY | Freq: Four times a day (QID) | RESPIRATORY_TRACT | 0 refills | Status: DC | PRN

## 2018-05-16 MED ORDER — IPRATROPIUM-ALBUTEROL 0.5-2.5 (3) MG/3ML IN SOLN
3.0000 mL | Freq: Once | RESPIRATORY_TRACT | Status: AC
  Administered 2018-05-16: 3 mL via RESPIRATORY_TRACT

## 2018-05-16 MED ORDER — GUAIFENESIN-CODEINE 100-10 MG/5ML PO SOLN: ORAL | 0 refills | Status: DC

## 2018-05-16 NOTE — ED Triage Notes (Signed)
Pt presents with upper respiratory cold; nasal drainage, chest congestion, chills, persistent cough, headache, body aches, back pain

## 2018-05-16 NOTE — Discharge Instructions (Signed)
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

## 2018-05-16 NOTE — ED Notes (Signed)
Patient able to ambulate independently  

## 2018-05-16 NOTE — ED Provider Notes (Signed)
[facility]    CSN: [id number] Arrival date & time: 05/16/18  1706     History   Chief Complaint Chief Complaint  Patient presents with  . URI    HPI Lisa Chase is a 47 y.o. female.   Who present with URI symptoms Mid November, went  To PCP the week after Thanksgiving and had not had a fever and was given an inhaler, tessalon, Dimetap and atrovent nose spray. The cough has persistent cough which is causing her back to hurt. Denies fever, chills or sweats. She has been using the inhaler q 6h plus the other meds daily. Gets intermittent HA's, and get worse with cough. Has felt SOB. Her cough is non productive. She was not wheezing til now. Has not been around any one sick with pneumonia. She is prone to getting bronchitis around this season.      Past Medical History:  Diagnosis Date  . Arthritis    Rheumatoid dx 2010 on Methotrexate  . GERD (gastroesophageal reflux disease)     Patient Active Problem List   Diagnosis Date Noted  . Medication management 11/14/2017  . ARTHRITIS, RHEUMATOID 01/28/2009  . 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 No pertinent surgical history.  OB History   None      Home Medications    Prior to Admission medications   Medication Sig Start Date End Date Taking? Authorizing Provider  albuterol (PROVENTIL HFA;VENTOLIN HFA) 108 (90 Base) MCG/ACT inhaler Inhale 2 puffs into the lungs every 6 (six) hours as needed for wheezing or shortness of breath. 05/16/18   Rodriguez-Southworth, Nettie Elm, PA-C  azithromycin (ZITHROMAX Z-PAK) 250 MG tablet Take 2 today, then 1 qd x 4 05/16/18   Rodriguez-Southworth, Nettie Elm, PA-C  etanercept (ENBREL SURECLICK) 50 MG/ML injection Inject 0.98 mLs (50 mg total) into the skin once a week. (1 ml) 04/16/18   Quentin Angst, MD  folic acid (FOLVITE) 1 MG tablet Take 1 mg by mouth daily.    [provider]  guaiFENesin-codeine 100-10 MG/5ML syrup 5-10 ml qhs prn cough  05/16/18   Rodriguez-Southworth, Nettie Elm, PA-C  ipratropium (ATROVENT) 0.03 % nasal spray Place 2 sprays into both nostrils every 12 (twelve) hours. 04/24/18   Zachery Dauer, NP  methotrexate 1 G injection Inject into the vein once.    [provider]  naproxen (NAPROSYN) 500 MG tablet Take 500 mg by mouth 2 (two) times daily with a meal.    [provider]  predniSONE (DELTASONE) 5 MG tablet Take 5 mg by mouth daily with breakfast.    [provider]    Family History Family History  Problem Relation Age of Onset  . Heart disease Mother     Social History Social History   Tobacco Use  . Smoking status: Never Smoker  . Smokeless tobacco: Never Used  Substance Use Topics  . Alcohol use: No  . Drug use: No     Allergies   Patient has no known allergies.   Review of Systems Review of Systems  Constitutional: Positive for chills and fatigue. Negative for activity change, appetite change, diaphoresis and fever.  HENT: Positive for postnasal drip and sore throat. Negative for ear discharge, ear pain, rhinorrhea and trouble swallowing.        ST is provoked with breathing and cough. Post nasal drainage x 2 days  Eyes: Negative for discharge.  Respiratory: Positive for cough, shortness of breath and wheezing. Negative for chest tightness.   Cardiovascular:  Negative for chest pain.  Gastrointestinal: Negative for diarrhea and nausea.  Musculoskeletal: Positive for arthralgias, back pain and joint swelling.  Skin: Negative for rash.  Neurological: Positive for headaches.  Hematological: Negative for adenopathy.     Physical Exam Triage Vital Signs ED Triage Vitals [05/16/18 1743]  Enc Vitals Group     BP      Pulse      Resp      Temp      Temp src      SpO2      Weight      Height      Head Circumference      Peak Flow      Pain Score 7     Pain Loc      Pain Edu?      Excl. in GC?    No data found.  Updated Vital Signs BP 139/67 (BP  Location: Left Arm)   Pulse 64   Temp (!) 97.4 F (36.3 C) (Oral)   Resp 18   LMP 05/02/2018   SpO2 99%   Visual Acuity Right Eye Distance:   Left Eye Distance:   Bilateral Distance:    Right Eye Near:   Left Eye Near:    Bilateral Near:     Physical Exam  Constitutional: She is oriented to person, place, and time. She appears well-developed and well-nourished. No distress.  HENT:  Head: Normocephalic.  Right Ear: External ear normal.  Left Ear: External ear normal.  Nose: Nose normal.  Mouth/Throat: Oropharynx is clear and moist. No oropharyngeal exudate.  Eyes: Conjunctivae are normal. Right eye exhibits no discharge. Left eye exhibits no discharge. No scleral icterus.  Neck: Neck supple. No tracheal deviation present.  Cardiovascular: Normal rate.  Pulmonary/Chest: Breath sounds normal. No stridor. No respiratory distress. She has no wheezes. She has no rales.  Lymphadenopathy:    She has no cervical adenopathy.  Neurological: She is alert and oriented to person, place, and time.  Skin: Skin is warm and dry. She is not diaphoretic.  Psychiatric: She has a normal mood and affect. Her behavior is normal. Thought content normal.  Nursing note and vitals reviewed.  UC Treatments / Results  Labs (all labs ordered are listed, but only abnormal results are displayed) Labs Reviewed - No data to display  EKG None  Radiology  Negative CXr Procedures Procedures  Medications Ordered in UC Medications  ipratropium-albuterol (DUONEB) 0.5-2.5 (3) MG/3ML nebulizer solution 3 mL (3 mLs Nebulization Given 05/16/18 1849)    Initial Impression / Assessment and Plan / UC Course  I have reviewed the triage vital signs and the nursing notes.  Pertine imaging results that were available during my care of the patient were reviewed by me and considered in my medical decision making (see chart for details). I suspect she may have Mycoplasma bronchitis and I placed her on  Azithromycin as noted and Robitussin with codeine. I went ahead and refilled the Proventil inhaler in case she needs it.  Fu with PCP if she does not improve   Final Clinical Impressions(s) / UC Diagnoses   Final diagnoses:  Bronchitis     Discharge Instructions     I believe you may have Mycoplasma bronchitis.     ED Prescriptions    Medication Sig Dispense Auth. Provider   azithromycin (ZITHROMAX Z-PAK) 250 MG tablet Take 2 today, then 1 qd x 4 6 tablet Rodriguez-Southworth, Zyra Parrillo, PA-C   guaiFENesin-codeine 100-10 MG/5ML syrup 5-10  ml qhs prn cough 120 mL Rodriguez-Southworth, Jakia Kennebrew, PA-C   albuterol (PROVENTIL HFA;VENTOLIN HFA) 108 (90 Base) MCG/ACT inhaler Inhale 2 puffs into the lungs every 6 (six) hours as needed for wheezing or shortness of breath. 1 Inhaler Rodriguez-Southworth, Nettie Elm, PA-C     Controlled Substance Prescriptions  Controlled Substance Registry consulted?    Garey Ham, Cordelia Poche 05/16/18 2107

## 2018-05-18 MED FILL — PROAIR HFA 90 MCG INHALER: 108 (90 BAS | 25 days supply | Qty: 9 | Fill #0

## 2018-06-01 MED FILL — ENBREL SURECLICK 50 MG/ML S: 50 | 28 days supply | Qty: 4 | Fill #1

## 2018-07-02 MED FILL — ENBREL SURECLICK 50 MG/ML S: 50 | 28 days supply | Qty: 4 | Fill #2

## 2018-07-09 ENCOUNTER — Ambulatory Visit (INDEPENDENT_AMBULATORY_CARE_PROVIDER_SITE_OTHER): Payer: No Typology Code available for payment source | Admitting: Family Medicine

## 2018-07-09 ENCOUNTER — Encounter: Payer: Self-pay | Admitting: Family Medicine

## 2018-07-09 VITALS — BP 118/80 | HR 82 | Temp 98.0°F | Wt 163.0 lb

## 2018-07-09 DIAGNOSIS — S29011A Strain of muscle and tendon of front wall of thorax, initial encounter: Secondary | ICD-10-CM

## 2018-07-09 DIAGNOSIS — L309 Dermatitis, unspecified: Secondary | ICD-10-CM | POA: Diagnosis not present

## 2018-07-09 DIAGNOSIS — M069 Rheumatoid arthritis, unspecified: Secondary | ICD-10-CM | POA: Diagnosis not present

## 2018-07-09 MED ORDER — HYDROCORTISONE 1 % EX CREA: 1.0000 "application " | TOPICAL_CREAM | Freq: Two times a day (BID) | CUTANEOUS | 0 refills | Status: AC

## 2018-07-09 NOTE — Patient Instructions (Signed)
Atopic Dermatitis Atopic dermatitis is a skin disorder that causes inflammation of the skin. This is the most common type of eczema. Eczema is a group of skin conditions that cause the skin to be itchy, red, and swollen. This condition is generally worse during the cooler winter months and often improves during the warm summer months. Symptoms can vary from person to person. Atopic dermatitis usually starts showing signs in infancy and can last through adulthood. This condition cannot be passed from one person to another (non-contagious), but it is more common in families. Atopic dermatitis may not always be present. When it is present, it is called a flare-up. What are the causes? The exact cause of this condition is not known. Flare-ups of the condition may be triggered by:  Contact with something that you are sensitive or allergic to.  Stress.  Certain foods.  Extremely hot or cold weather.  Harsh chemicals and soaps.  Dry air.  Chlorine. What increases the risk? This condition is more likely to develop in people who have a personal history or family history of eczema, allergies, asthma, or hay fever. What are the signs or symptoms? Symptoms of this condition include:  Dry, scaly skin.  Red, itchy rash.  Itchiness, which can be severe. This may occur before the skin rash. This can make sleeping difficult.  Skin thickening and cracking that can occur over time. How is this diagnosed? This condition is diagnosed based on your symptoms, a medical history, and a physical exam. How is this treated? There is no cure for this condition, but symptoms can usually be controlled. Treatment focuses on:  Controlling the itchiness and scratching. You may be given medicines, such as antihistamines or steroid creams.  Limiting exposure to things that you are sensitive or allergic to (allergens).  Recognizing situations that cause stress and developing a plan to manage stress. If your  atopic dermatitis does not get better with medicines, or if it is all over your body (widespread), a treatment using a specific type of light (phototherapy) may be used. Follow these instructions at home: Skin care   Keep your skin well-moisturized. Doing this seals in moisture and helps to prevent dryness. ? Use unscented lotions that have petroleum in them. ? Avoid lotions that contain alcohol or water. They can dry the skin.  Keep baths or showers short (less than 5 minutes) in warm water. Do not use hot water. ? Use mild, unscented cleansers for bathing. Avoid soap and bubble bath. ? Apply a moisturizer to your skin right after a bath or shower.  Do not apply anything to your skin without checking with your health care provider. General instructions  Dress in clothes made of cotton or cotton blends. Dress lightly because heat increases itchiness.  When washing your clothes, rinse your clothes twice so all of the soap is removed.  Avoid any triggers that can cause a flare-up.  Try to manage your stress.  Keep your fingernails cut short.  Avoid scratching. Scratching makes the rash and itchiness worse. It may also result in a skin infection (impetigo) due to a break in the skin caused by scratching.  Take or apply over-the-counter and prescription medicines only as told by your health care provider.  Keep all follow-up visits as told by your health care provider. This is important.  Do not be around people who have cold sores or fever blisters. If you get the infection, it may cause your atopic dermatitis to worsen. Contact a health   care provider if:  Your itchiness interferes with sleep.  Your rash gets worse or it is not better within one week of starting treatment.  You have a fever.  You have a rash flare-up after having contact with someone who has cold sores or fever blisters. Get help right away if:  You develop pus or soft yellow scabs in the rash  area. Summary  This condition causes a red rash and itchy, dry, scaly skin.  Treatment focuses on controlling the itchiness and scratching, limiting exposure to things that you are sensitive or allergic to (allergens), recognizing situations that cause stress, and developing a plan to manage stress.  Keep your skin well-moisturized.  Keep baths or showers shorter than 5 minutes and use warm water. Do not use hot water. This information is not intended to replace advice given to you by your health care provider. Make sure you discuss any questions you have with your health care provider. Document Released: 05/20/2000 Document Revised: 06/24/2016 Document Reviewed: 06/24/2016 Elsevier Interactive Patient Education  2019 [employer]  Muscle Strain A muscle strain is an injury that happens when a muscle is stretched longer than normal. This can happen during a fall, sports, or lifting. This can tear some muscle fibers. Usually, recovery from muscle strain takes 1-2 weeks. Complete healing normally takes 5-6 weeks. This condition is first treated with PRICE therapy. This involves:  Protecting your muscle from being injured again.  Resting your injured muscle.  Icing your injured muscle.  Applying pressure (compression) to your injured muscle. This may be done with a splint or elastic bandage.  Raising (elevating) your injured muscle. Your doctor may also recommend medicine for pain. Follow these instructions at home: If you have a splint:  Wear the splint as told by your doctor. Take it off only as told by your doctor.  Loosen the splint if your fingers or toes tingle, get numb, or turn cold and blue.  Keep the splint clean.  If the splint is not waterproof: ? Do not let it get wet. ? Cover it with a watertight covering when you take a bath or a shower. Managing pain, stiffness, and swelling   If directed, put ice on your injured area. ? If you have a removable splint, take  it off as told by your doctor. ? Put ice in a plastic bag. ? Place a towel between your skin and the bag. ? Leave the ice on for 20 minutes, 2-3 times a day.  Move your fingers or toes often. This helps to avoid stiffness and lessen swelling.  Raise your injured area above the level of your heart while you are sitting or lying down.  Wear an elastic bandage as told by your doctor. Make sure it is not too tight. General instructions  Take over-the-counter and prescription medicines only as told by your doctor.  Limit your activity. Rest your injured muscle as told by your doctor. Your doctor may say that gentle movements are okay.  If physical therapy was prescribed, do exercises as told by your doctor.  Do not put pressure on any part of the splint until it is fully hardened. This may take many hours.  Do not use any products that contain nicotine or tobacco, such as cigarettes and e-cigarettes. These can delay bone healing. If you need help quitting, ask your doctor.  Warm up before you exercise. This helps to prevent more muscle strains.  Ask your doctor when it is safe to drive  if you have a splint.  Keep all follow-up visits as told by your doctor. This is important. Contact a doctor if:  You have more pain or swelling in your injured area. Get help right away if:  You have any of these problems in your injured area: ? You have numbness. ? You have tingling. ? You lose a lot of strength. Summary  A muscle strain is an injury that happens when a muscle is stretched longer than normal.  This condition is first treated with PRICE therapy. This includes protecting, resting, icing, adding pressure, and raising your injury.  Limit your activity. Rest your injured muscle as told by your doctor. Your doctor may say that gentle movements are okay.  Warm up before you exercise. This helps to prevent more muscle strains. This information is not intended to replace advice given  to you by your health care provider. Make sure you discuss any questions you have with your health care provider. Document Released: 03/01/2008 Document Revised: 06/29/2016 Document Reviewed: 06/29/2016 Elsevier Interactive Patient Education  2019 [employer] Inc.  Rheumatoid Arthritis Rheumatoid arthritis (RA) is a long-term (chronic) disease that causes inflammation in your joints. RA may start slowly. It usually affects the small joints of the hands and feet. Usually, the same joints are affected on both sides of your body. Inflammation from RA can also affect other parts of your body, including your heart, eyes, or lungs. RA is an autoimmune disease. That means that your body's defense system (immune system) mistakenly attacks healthy body tissues. There is no cure for RA, but medicines can help your symptoms and halt or slow down the progression of the disease. What are the causes? The exact cause of RA is not known. What increases the risk? This condition is more likely to develop in:  Women.  People who have a family history of RA or other autoimmune diseases. What are the signs or symptoms? Symptoms of this condition vary from person to person. Symptoms usually start gradually. They are often worse in the morning. The first symptom may be morning stiffness that lasts longer than 30 minutes. As RA progresses, symptoms may include:  Pain, stiffness, swelling, warmth, and tenderness in joints on both sides of your body.  Loss of energy.  Loss of appetite.  Weight loss.  Low-grade fever.  Dry eyes and dry mouth.  Firm lumps (rheumatoid nodules) that grow beneath your skin in areas such as your forearm bones near your elbows and on your hands.  Changes in the appearance of joints (deformity) and loss of joint function. Symptoms of RA often come and go. Sometimes, symptoms get worse for a period of time. These are called flares. How is this diagnosed? This condition is diagnosed  based on your symptoms, medical history, and physical exam. You may have X-rays or MRI to check for the type of joint changes that are caused by RA. You may also have blood tests to look for:  Proteins (antibodies) that your immune system may make if you have RA. They include rheumatoid factor (RF) and anti-CCP. ? When blood tests show these proteins, you are said to have "seropositive RA." ? When blood tests do not show these proteins, you may have "seronegative RA."  Inflammation in your blood.  A low number of red blood cells (anemia). How is this treated? The goals of treatment are to relieve pain, reduce inflammation, and slow down or stop joint damage and disability. Treatment may include:  Lifestyle changes. It  is important to rest, eat a healthy diet, and exercise.  Medicines. Your health care provider may adjust your medicines every 3 months until treatment goals are reached. Common medicines include: ? Pain relievers (analgesics). ? Corticosteroids and NSAIDs to reduce inflammation. ? Disease-modifying antirheumatic drugs (DMARDs) to try to slow the course of the disease. ? Biologic response modifiers to reduce inflammation and damage.  Physical therapy and occupational therapy.  Surgery, if you have severe joint damage. Joint replacement or fusing of joints may be needed. Your health care provider will work with you to identify the best treatment option for you based on assessment of the overall disease activity in your body. Follow these instructions at home:  Take over-the-counter and prescription medicines only as told by your health care provider.  Start an exercise program as told by your health care provider.  Rest when you are having a flare.  Return to your normal activities as told by your health care provider. Ask your health care provider what activities are safe for you.  Keep all follow-up visits as told by your health care provider. This is important. Where  to find more information  [employer]: www.rheumatology.org  Arthritis Foundation: www.arthritis.org Contact a health care provider if:  You have a flare-up of RA symptoms.  You have a fever.  You have side effects from your medicines. Get help right away if:  You have chest pain.  You have trouble breathing.  You quickly develop a hot, painful joint that is more severe than your usual joint aches. This information is not intended to replace advice given to you by your health care provider. Make sure you discuss any questions you have with your health care provider. Document Released: 05/20/2000 Document Revised: 11/03/2016 Document Reviewed: 03/05/2015 Elsevier Interactive Patient Education  2019 [employer].

## 2018-07-09 NOTE — Progress Notes (Signed)
Subjective:    Patient ID: Lisa Chase, female    DOB: [birth year], 48 y.o.   MRN: [id number]  No chief complaint on file.   HPI Patient was seen today for f/u.  Pt states her new insurance required she have a referral to her rheumatologist, however she was later told she does not need a referral.  Pt is seen by Dr. Hyman Hopes.  Currently on Embrel injection.  Pt notes soreness in L ribs x 2.5 wks.  Pt does not recall any pushing, pulling, heavy lifting, changes in routine.  Patient does state she was sick x1 month with heavy coughing.  The pain is white 2-3/10, but sharp.  Pain moves around patient's left side to her back.  Patient notes the pain more when laying down at night/moving around in bed.  Patient has not taken anything for her symptoms.  Pt has pruritic rash on upper R forehead.  Present x a while.  Skin is faintly hyperpigmented.  Past Medical History:  Diagnosis Date  . Arthritis    Rheumatoid dx 2010 on Methotrexate  . GERD (gastroesophageal reflux disease)     No Known Allergies  ROS General: Denies fever, chills, night sweats, changes in weight, changes in appetite HEENT: Denies headaches, ear pain, changes in vision, rhinorrhea, sore throat CV: Denies CP, palpitations, SOB, orthopnea Pulm: Denies SOB, cough, wheezing GI: Denies abdominal pain, nausea, vomiting, diarrhea, constipation GU: Denies dysuria, hematuria, frequency, vaginal discharge Msk: Denies muscle cramps, joint pains    +L rib pain, RA Neuro: Denies weakness, numbness, tingling Skin: Denies rashes, bruising Psych: Denies depression, anxiety, hallucinations     Objective:    Blood pressure 118/80, pulse 82, temperature 98 F (36.7 C), temperature source Oral, weight 163 lb (73.9 kg), SpO2 97 %.  Gen. Pleasant, well-nourished, in no distress, normal affect   HEENT: [place]/AT, face symmetric, no scleral icterus, PERRLA, nares patent without drainage Lungs: no accessory muscle use, CTAB, no  wheezes or rales Cardiovascular: RRR, no m/r/g, no peripheral edema Musculoskeletal: TTP of L lower ribs and side.  No deformities, no cyanosis or clubbing, normal tone.  Mild deformities of fingers Neuro:  A&Ox3, CN II-XII intact, normal gait Skin:  Warm, no lesions.  Faint hyperpigmentation of R upper forehead with fine papules.   Wt Readings from Last 3 Encounters:  07/09/18 163 lb (73.9 kg)  04/24/18 164 lb 12.8 oz (74.8 kg)  09/11/17 162 lb (73.5 kg)    Lab Results  Component Value Date   WBC 6.9 09/11/2017   HGB 13.7 09/11/2017   HCT 40.6 09/11/2017   PLT 345.0 09/11/2017   GLUCOSE 99 09/11/2017   CHOL 103 09/11/2017   TRIG 108.0 09/11/2017   HDL 31.90 (L) 09/11/2017   LDLCALC 50 09/11/2017   ALT 82 (H) 04/16/2014   AST 109 (H) 04/16/2014   NA 139 09/11/2017   K 4.3 09/11/2017   CL 106 09/11/2017   CREATININE 0.89 09/11/2017   BUN 12 09/11/2017   CO2 25 09/11/2017   HGBA1C 6.2 09/11/2017    Assessment/Plan:  Rheumatoid arthritis involving multiple sites, unspecified rheumatoid factor presence (HCC) -continue following with Rheumatology -continue Embrel -will place referral if needed  Intercostal muscle strain, initial encounter -likely 2/2 coughing  -discussed supportive care, rest, ice/heat, NSAIDs -given handout  Dermatitis  -given handout - Plan: hydrocortisone cream 1 %  F/u prn  Abbe Amsterdam, MD

## 2018-07-23 ENCOUNTER — Ambulatory Visit (INDEPENDENT_AMBULATORY_CARE_PROVIDER_SITE_OTHER): Payer: No Typology Code available for payment source | Admitting: Family Medicine

## 2018-07-23 ENCOUNTER — Encounter: Payer: Self-pay | Admitting: Family Medicine

## 2018-07-23 VITALS — BP 110/84 | HR 78 | Temp 98.4°F | Wt 165.0 lb

## 2018-07-23 DIAGNOSIS — J40 Bronchitis, not specified as acute or chronic: Secondary | ICD-10-CM

## 2018-07-23 MED ORDER — AZITHROMYCIN 250 MG PO TABS: ORAL_TABLET | ORAL | 0 refills | Status: DC

## 2018-07-23 MED ORDER — ALBUTEROL SULFATE 108 (90 BASE) MCG/ACT IN AEPB: 2.0000 | INHALATION_SPRAY | Freq: Four times a day (QID) | RESPIRATORY_TRACT | 0 refills | Status: DC | PRN

## 2018-07-23 MED ORDER — HYDROCODONE-HOMATROPINE 5-1.5 MG/5ML PO SYRP: 5.0000 mL | ORAL_SOLUTION | Freq: Three times a day (TID) | ORAL | 0 refills | Status: DC | PRN

## 2018-07-23 MED ORDER — BENZONATATE 100 MG PO CAPS: 100.0000 mg | ORAL_CAPSULE | Freq: Two times a day (BID) | ORAL | 0 refills | Status: DC | PRN

## 2018-07-23 MED FILL — PROAIR RESPICLICK INHAL PWD: 108 (90 BAS | 25 days supply | Qty: 1 | Fill #0

## 2018-07-23 MED FILL — AZITHROMYCIN 250 MG TABLET: 250 | 5 days supply | Qty: 6 | Fill #0

## 2018-07-23 MED FILL — BENZONATATE 100 MG CAPS: 100 | 10 days supply | Qty: 20 | Fill #0

## 2018-07-23 MED FILL — HYDROCODONE-HOMATROPINE SOL: 5-1.5 | 8 days supply | Qty: 120 | Fill #0

## 2018-07-23 NOTE — Progress Notes (Signed)
Subjective:    Patient ID: Lisa Chase, female    DOB: March 28, 1971, 48 y.o.   MRN: [id number]  No chief complaint on file. Today is pt's birthday!  HPI Patient was seen today for ongoing concern.  Patient with cough and chest congestion causing pain in the mid back since x 5 days.  Pt states symptoms started with rhinorrhea and HA on Thursday.  She now has a dry cough that keeps her up at night.  Pt denies ear pain or pressure, fever, chills, nausea, vomiting, sore throat.  Sick contacts may include coworkers.   Pt had bronchitis in Dec 2019.  Past Medical History:  Diagnosis Date  . Arthritis    Rheumatoid dx 2010 on Methotrexate  . GERD (gastroesophageal reflux disease)     No Known Allergies  ROS General: Denies fever, chills, night sweats, changes in weight, changes in appetite HEENT: Denies ear pain, changes in vision, sore throat  +HA, rhinorrhea CV: Denies CP, palpitations, SOB, orthopnea Pulm: Denies SOB, wheezing  +cough, chest congestion GI: Denies abdominal pain, nausea, vomiting, diarrhea, constipation GU: Denies dysuria, hematuria, frequency, vaginal discharge Msk: Denies muscle cramps, joint pains Neuro: Denies weakness, numbness, tingling Skin: Denies rashes, bruising Psych: Denies depression, anxiety, hallucinations    Objective:    Blood pressure 110/84, pulse 78, temperature 98.4 F (36.9 C), temperature source Oral, weight 165 lb (74.8 kg), SpO2 96 %.  Gen. Pleasant, well-nourished, in no distress, appears sick but nontoxic, normal affect   HEENT: Rhea/AT, face symmetric, no scleral icterus, PERRLA, nares patent without drainage, pharynx without erythema or exudate.  TMs normal b/l. Lungs: no accessory muscle use, CTAB, faint bibasilar wheeze Cardiovascular: RRR, no m/r/g, no peripheral edema Neuro:  A&Ox3, CN II-XII intact, normal gait Skin:  Warm, no lesions/ rash  Wt Readings from Last 3 Encounters:  07/23/18 165 lb (74.8 kg)  07/09/18  163 lb (73.9 kg)  04/24/18 164 lb 12.8 oz (74.8 kg)    Lab Results  Component Value Date   WBC 6.9 09/11/2017   HGB 13.7 09/11/2017   HCT 40.6 09/11/2017   PLT 345.0 09/11/2017   GLUCOSE 99 09/11/2017   CHOL 103 09/11/2017   TRIG 108.0 09/11/2017   HDL 31.90 (L) 09/11/2017   LDLCALC 50 09/11/2017   ALT 82 (H) 04/16/2014   AST 109 (H) 04/16/2014   NA 139 09/11/2017   K 4.3 09/11/2017   CL 106 09/11/2017   CREATININE 0.89 09/11/2017   BUN 12 09/11/2017   CO2 25 09/11/2017   HGBA1C 6.2 09/11/2017    Assessment/Plan:  Bronchitis -discussed treatment options -pt declines prednisone burst as takes prednisone 5 mg for RA. -pt to use albuterol inhaler she has at home.  Rx for new inhaler written in case she runs out. -pt given a wait and see rx for Azithromycin. -given hycodan cough syrup and tessalon for cough -given a note for work  - Plan: HYDROcodone-homatropine (HYCODAN) 5-1.5 MG/5ML syrup, benzonatate (TESSALON) 100 MG capsule, Albuterol Sulfate (PROAIR RESPICLICK) 108 (90 Base) MCG/ACT AEPB, azithromycin (ZITHROMAX) 250 MG tablet -given RTC or ED precautions  F/u prn More than 50% of over 20 minutes spent face-to-face with the patient, counseling and/or coordinating care.   Abbe Amsterdam, MD

## 2018-07-23 NOTE — Patient Instructions (Signed)

## 2018-07-27 MED FILL — IPRATROPIUM 0.03% SPRAY: 0.03 | 43 days supply | Qty: 30 | Fill #0 | Status: TO

## 2018-07-27 MED FILL — ENBREL SURECLICK 50 MG/ML S: 50 | 28 days supply | Qty: 4 | Fill #3

## 2018-08-02 ENCOUNTER — Other Ambulatory Visit: Payer: Self-pay | Admitting: Family Medicine

## 2018-08-02 DIAGNOSIS — Z1231 Encounter for screening mammogram for malignant neoplasm of breast: Secondary | ICD-10-CM

## 2018-08-19 IMAGING — MG DIGITAL SCREENING BILATERAL MAMMOGRAM WITH TOMO AND CAD
8 series · 9 of 24 positions shown · non-contrast
Comparison: Previous exam(s).

CLINICAL DATA: Screening.

EXAM:
DIGITAL SCREENING BILATERAL MAMMOGRAM WITH TOMO AND CAD

[R CC synth-2D]
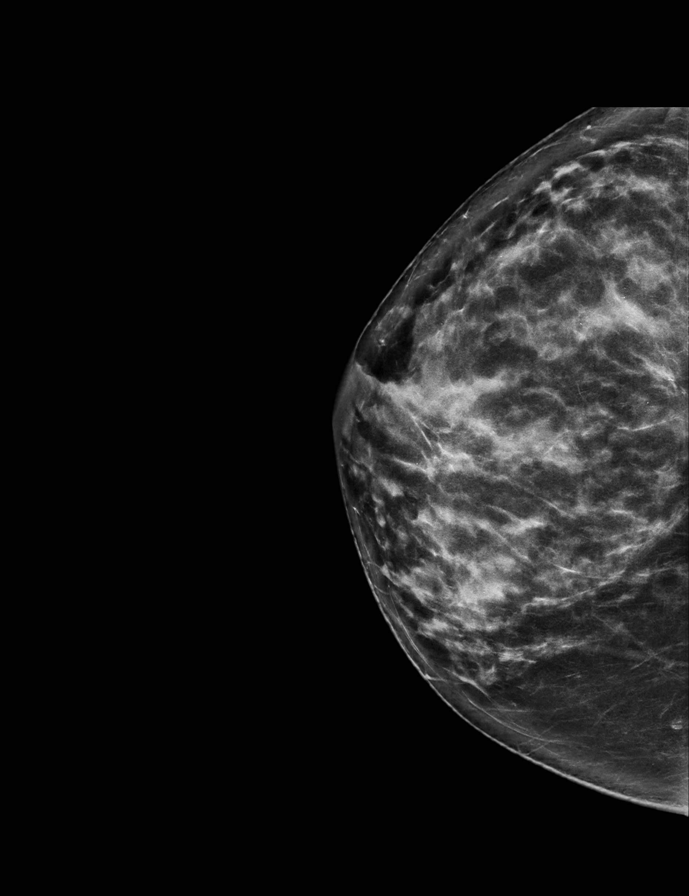

[L CC synth-2D]
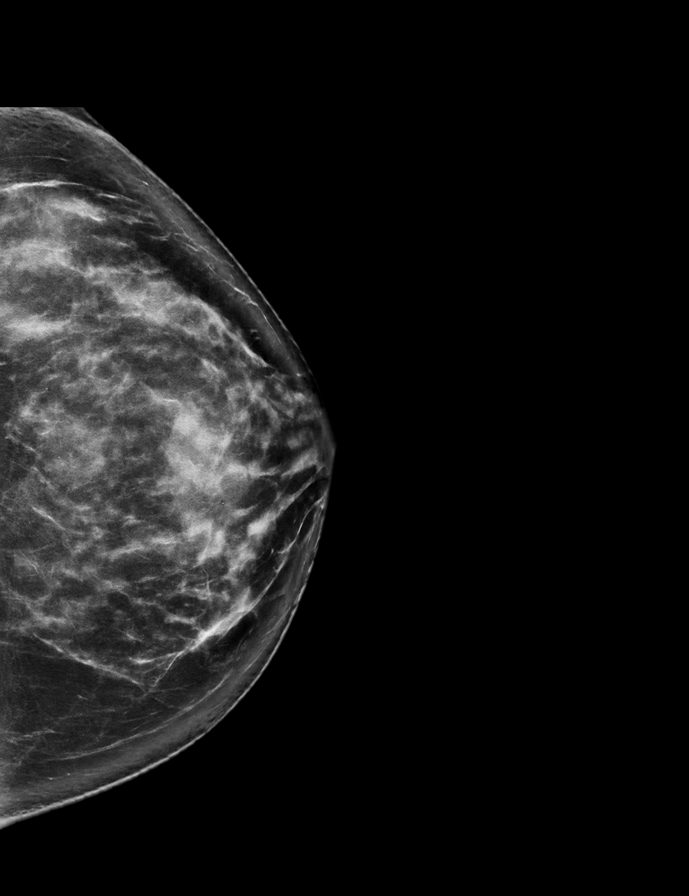

[R MLO synth-2D]
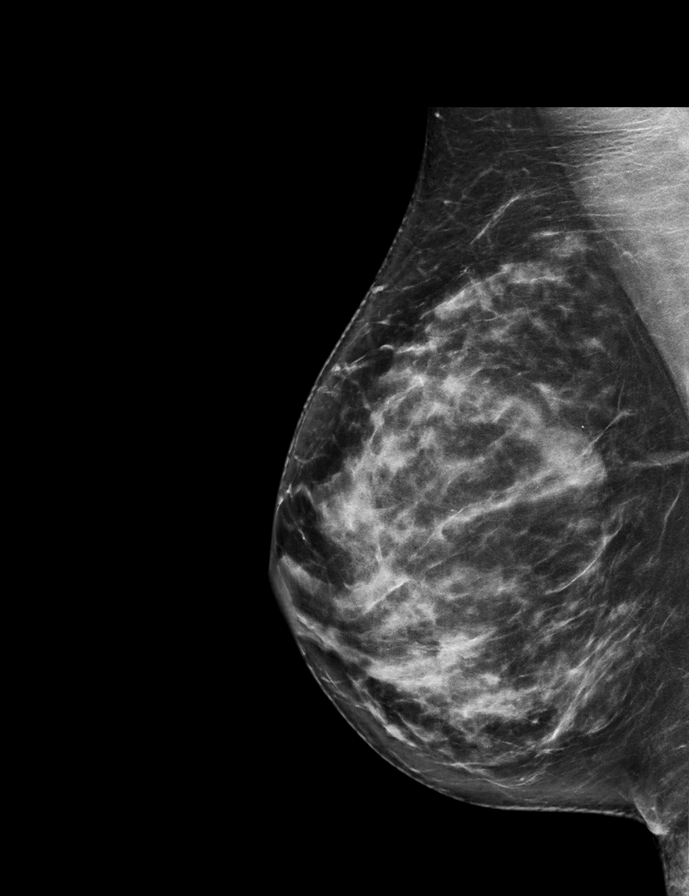

[L MLO synth-2D]
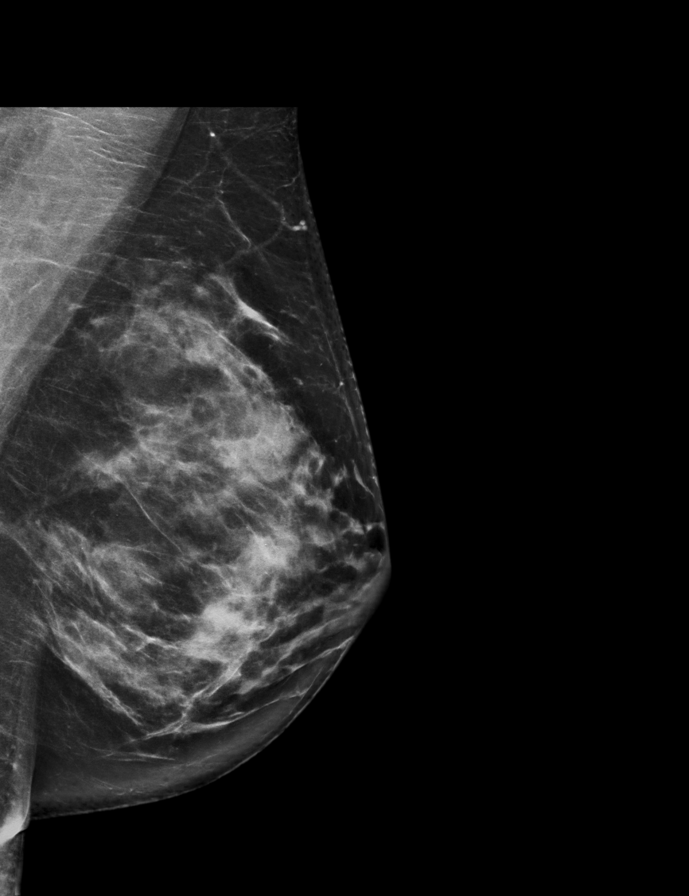

[R MLO tomo · 2 of 81 frames shown]
[frame 27/81]
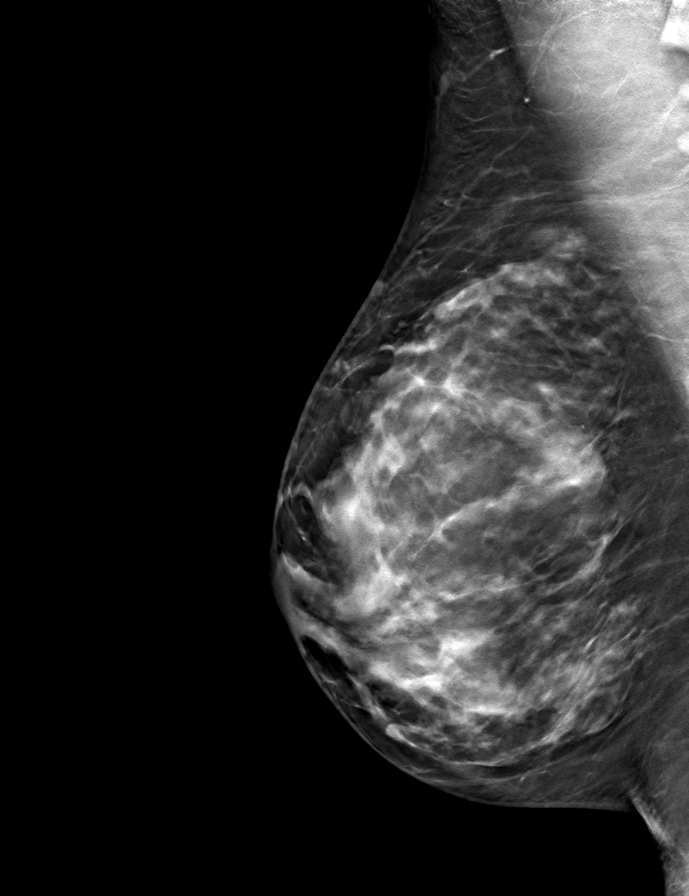
[frame 41/81]
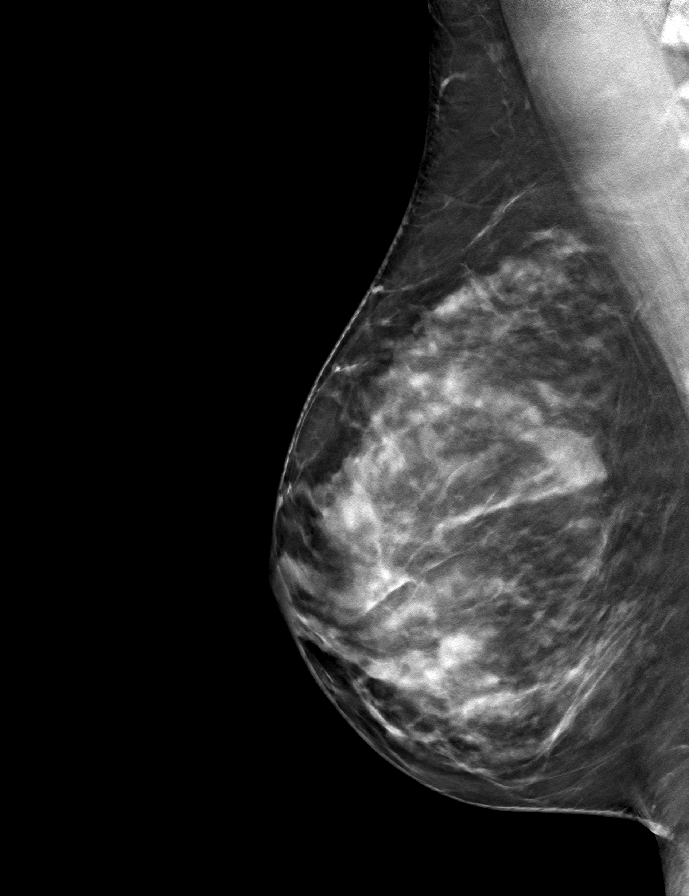

[L MLO tomo · tomo slice 41/82.0]
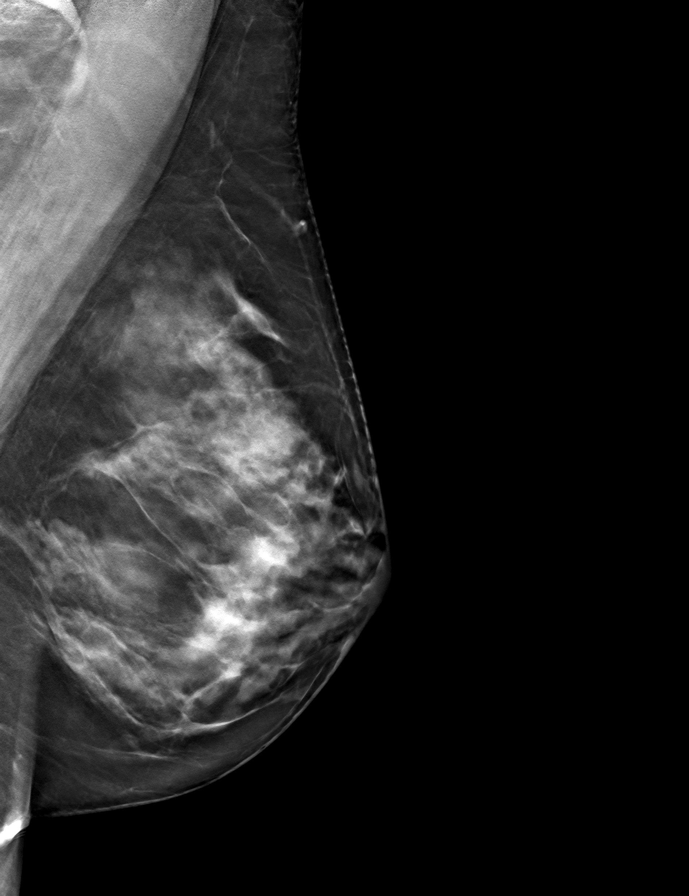

[R CC tomo · tomo slice 37/73.0]
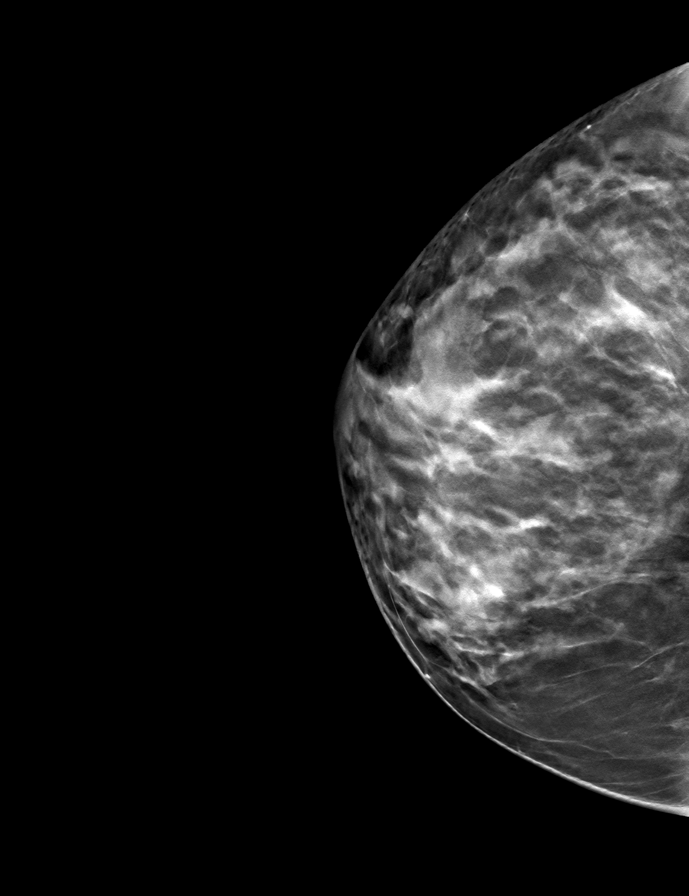

[L CC tomo · tomo slice 41/80.0]
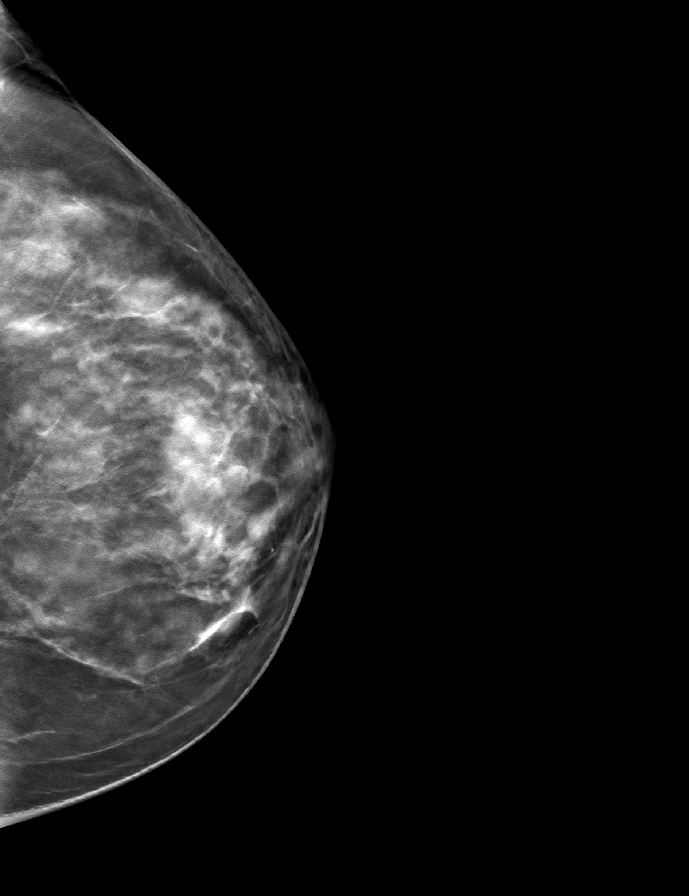

[9 of 24 positions shown; findings below may reference images not displayed]

ACR Breast Density Category c: The breast tissue is heterogeneously
dense, which may obscure small masses.
FINDINGS: There are no findings suspicious for malignancy. Images were
processed with CAD.
IMPRESSION: No mammographic evidence of malignancy. A result letter of this
screening mammogram will be mailed directly to the patient.

RECOMMENDATION:
Screening mammogram in one year. (Code:FT-U-LHB)

BI-RADS CATEGORY  1: Negative.

## 2018-08-21 MED FILL — ENBREL SURECLICK 50 MG/ML S: 50 | 28 days supply | Qty: 4 | Fill #4 | Status: TO

## 2018-08-21 MED FILL — FOLIC ACID 1 MG TABS: 1 | 90 days supply | Qty: 90 | Fill #1

## 2018-08-21 MED FILL — predniSONE 5 MG TABS: 5 | 90 days supply | Qty: 90 | Fill #1

## 2018-09-03 ENCOUNTER — Ambulatory Visit: Payer: No Typology Code available for payment source

## 2018-09-13 ENCOUNTER — Encounter: Payer: No Typology Code available for payment source | Admitting: Family Medicine

## 2018-09-13 ENCOUNTER — Ambulatory Visit: Payer: No Typology Code available for payment source

## 2018-09-20 MED FILL — ENBREL SURECLICK 50 MG/ML S: 50 | 28 days supply | Qty: 4 | Fill #0

## 2018-09-20 MED FILL — IPRATROPIUM 0.03% SPRAY: 0.03 | 43 days supply | Qty: 30 | Fill #0

## 2018-09-21 MED FILL — traMADol HCL 50 MG TABS: 50 | 14 days supply | Qty: 28 | Fill #0

## 2018-10-11 ENCOUNTER — Encounter: Payer: Self-pay | Admitting: Family Medicine

## 2018-10-30 ENCOUNTER — Other Ambulatory Visit: Payer: Self-pay | Admitting: Pharmacist

## 2018-10-30 MED ORDER — ETANERCEPT 50 MG/ML ~~LOC~~ SOAJ: 50.0000 mg | SUBCUTANEOUS | 1 refills | Status: DC

## 2018-10-30 MED FILL — ENBREL SURECLICK 50 MG/ML S: 50 | 30 days supply | Qty: 4 | Fill #0

## 2018-11-30 MED FILL — ENBREL SURECLICK 50 MG/ML S: 50 | 30 days supply | Qty: 4 | Fill #1

## 2018-12-06 ENCOUNTER — Other Ambulatory Visit: Payer: Self-pay

## 2018-12-06 ENCOUNTER — Ambulatory Visit: Payer: No Typology Code available for payment source | Attending: Family Medicine | Admitting: Pharmacist

## 2018-12-06 DIAGNOSIS — Z79899 Other long term (current) drug therapy: Secondary | ICD-10-CM

## 2018-12-06 NOTE — Progress Notes (Signed)
  S: Patient presents for review of their specialty medication therapy.  Patient is currently taking Enbrel for RA. Patient is managed by Dr. Otho Ket for this.  Dosing: RA - SubQ: Note: May continue methotrexate, glucocorticoids, salicylates, NSAIDs, or analgesics during etanercept therapy. Once-weekly dosing: 50 mg once weekly  Monitoring: Injection site reactions: reports occasional swelling/redness; resolves quickly and rheumatologist is aware S/sx of infections: denies  S/sx of malignancy: denies GI upset: denies   O: Lab Results  Component Value Date   WBC 6.9 09/11/2017   HGB 13.7 09/11/2017   HCT 40.6 09/11/2017   MCV 83.1 09/11/2017   PLT 345.0 09/11/2017      Chemistry      Component Value Date/Time   NA 139 09/11/2017 0831   K 4.3 09/11/2017 0831   CL 106 09/11/2017 0831   CO2 25 09/11/2017 0831   BUN 12 09/11/2017 0831   CREATININE 0.89 09/11/2017 0831   CREATININE 0.92 08/22/2012 2053      Component Value Date/Time   CALCIUM 9.1 09/11/2017 0831   ALKPHOS 51 04/16/2014 1210   AST 109 (H) 04/16/2014 1210   ALT 82 (H) 04/16/2014 1210   BILITOT 0.3 04/16/2014 1210     A/P: 1. Medication review: patient currently on Enbrel for RA and is tolerating it. Reviewed the medication with the patient, including the following: Enbrel (etanercept) binds tumor necrosis factor (TNF) and blocks its interaction with cell surface receptors. TNF plays an important role in the inflammatory processes of many diseases. Patient educated on purpose, proper use and potential adverse effects of Enbrel. Adverse effects include rash, GI upset, increased risk of infection, and injection site reactions. Patients should stop Enbrel if they develop a serious infection. There is a possible increased risk in lymphoma and other malignancies. No recommendations for any changes.   Benard Halsted, PharmD, [place] [phone]

## 2018-12-12 ENCOUNTER — Ambulatory Visit (INDEPENDENT_AMBULATORY_CARE_PROVIDER_SITE_OTHER): Payer: No Typology Code available for payment source | Admitting: Family Medicine

## 2018-12-12 ENCOUNTER — Other Ambulatory Visit: Payer: Self-pay

## 2018-12-12 ENCOUNTER — Encounter: Payer: Self-pay | Admitting: Family Medicine

## 2018-12-12 VITALS — BP 128/78 | HR 80 | Temp 98.3°F | Ht 63.0 in | Wt 165.0 lb

## 2018-12-12 DIAGNOSIS — R030 Elevated blood-pressure reading, without diagnosis of hypertension: Secondary | ICD-10-CM | POA: Diagnosis not present

## 2018-12-12 DIAGNOSIS — G588 Other specified mononeuropathies: Secondary | ICD-10-CM | POA: Diagnosis not present

## 2018-12-12 DIAGNOSIS — Z0000 Encounter for general adult medical examination without abnormal findings: Secondary | ICD-10-CM

## 2018-12-12 DIAGNOSIS — Z0001 Encounter for general adult medical examination with abnormal findings: Secondary | ICD-10-CM

## 2018-12-12 DIAGNOSIS — Z13220 Encounter for screening for lipoid disorders: Secondary | ICD-10-CM

## 2018-12-12 DIAGNOSIS — K760 Fatty (change of) liver, not elsewhere classified: Secondary | ICD-10-CM

## 2018-12-12 NOTE — Progress Notes (Signed)
Subjective:     Lisa Chase is a 48 y.o. female and is here for a comprehensive physical exam. The patient reports problems - fatty liver disease, elevated bp.  Pt states was told had fatty liver by another provider after labs and u/s.  Pt was advised to f/u with pcp.  Pt denies EtOH use or eating poorly.  Pt states she may have gained a little weight.  Pt mentions an occasional L sided upper flank pain that comes and goes.  Noted more at night if laying on L side.  Does not last long.  Pt denies injury, pain that moves, SOB, burning in stomach.  Pt also notes being told her bp was elevated at a recent [facility].  Pt denies HAs, CP, changes in vision.  Pt concerned Embrel for RA could be causing her symptoms.  Pt followed by Dr. Juluis Pitch, Rheumatology.  Mammogram- scheduled tomorrow Pap-due.   Immunizations up to date  Social History   Socioeconomic History  . Marital status: Married    Spouse name: Not on file  . Number of children: Not on file  . Years of education: Not on file  . Highest education level: Not on file  Occupational History  . Not on file  Social Needs  . Financial resource strain: Not on file  . Food insecurity    Worry: Not on file    Inability: Not on file  . Transportation needs    Medical: Not on file    Non-medical: Not on file  Tobacco Use  . Smoking status: Never Smoker  . Smokeless tobacco: Never Used  Substance and Sexual Activity  . Alcohol use: No  . Drug use: No  . Sexual activity: Yes  Lifestyle  . Physical activity    Days per week: Not on file    Minutes per session: Not on file  . Stress: Not on file  Relationships  . Social Herbalist on phone: Not on file    Gets together: Not on file    Attends religious service: Not on file    Active member of club or organization: Not on file    Attends meetings of clubs or organizations: Not on file    Relationship status: Not on file  . Intimate partner violence    Fear of  current or ex partner: Not on file    Emotionally abused: Not on file    Physically abused: Not on file    Forced sexual activity: Not on file  Other Topics Concern  . Not on file  Social History Narrative  . Not on file   Health Maintenance  Topic Date Due  . HIV Screening  07/23/1985  . TETANUS/TDAP  07/23/1989  . PAP SMEAR-Modifier  07/24/1991  . INFLUENZA VACCINE  01/05/2019    The following portions of the patient's history were reviewed and updated as appropriate: allergies, current medications, past family history, past medical history, past social history, past surgical history and problem list.  Review of Systems Pertinent items noted in HPI and remainder of comprehensive ROS otherwise negative.   Objective:    BP 128/78 (BP Location: Right Arm, Patient Position: Sitting, Cuff Size: Normal)   Pulse 80   Temp 98.3 F (36.8 C) (Oral)   Ht 5\' 3"  (1.6 m)   Wt 165 lb (74.8 kg)   SpO2 97%   BMI 29.23 kg/m  General appearance: alert, cooperative and no distress Head: Normocephalic, without obvious abnormality, atraumatic  Eyes: conjunctivae/corneas clear. PERRL, EOM's intact. Fundi benign. Ears: normal TM's and external ear canals both ears Nose: Nares normal. Septum midline. Mucosa normal. No drainage or sinus tenderness. Throat: lips, mucosa, and tongue normal; teeth and gums normal Neck: no adenopathy, no carotid bruit, no JVD, supple, symmetrical, trachea midline and thyroid not enlarged, symmetric, no tenderness/mass/nodules Lungs: clear to auscultation bilaterally Heart: regular rate and rhythm, S1, S2 normal, no murmur, click, rub or gallop Abdomen: soft, non-tender; bowel sounds normal; no masses,  no organomegaly normal liver span per percussion. Extremities: extremities normal, atraumatic, no cyanosis or edema Skin: Skin color, texture, turgor normal. No rashes or lesions Lymph nodes: Cervical, supraclavicular, and axillary nodes normal. Neurologic: Alert and  oriented X 3, normal strength and tone. Normal symmetric reflexes. Normal coordination and gait    Assessment:    Healthy female exam with recent dx of fatty liver and elevated bp.     Plan:     Anticipatory guidance given including wearing seatbelts, smoke detectors in the home, increasing physical activity, increasing p.o. intake of water and vegetables. -will obtain labs when fasting -pt declines pap.  Discussed r/b/a.  Pt willing to consider pap next yr. -mammogram scheduled for tomorrow 12/13/18 -immunizations up to date.  Offered tetanus, declines at this time. -given handout See After Visit Summary for Counseling Recommendations    Screening for cholesterol level  -Plan: Lipid panel  NAFL (nonalcoholic fatty liver)  -discussed possible causes. -CMP reviewed from 10/11/18  AST 65, ALT 81 -given handout - Plan: CBC with Differential/Platelet, Hemoglobin A1c, Comprehensive metabolic panel  Intercostal neuralgia -Discussed stretching exercises, heat, NSAIDs or topical analgesics. -Will continue to monitor  Elevated blood pressure reading without diagnosis of hypertension -bp initially 140/88 in L arm.  Repeat 128/78 in R arm -pt advised to obtain bp cuff to monitor bp at home. -pt advised to contact clinic if bp consistently >140/90. Discussed medication options -discussed lifestyle modifications -f/u in 1 month  Abbe Amsterdam, MD

## 2018-12-12 NOTE — Patient Instructions (Signed)
Preventive Care 40-48 Years Old, Female °Preventive care refers to visits with your health care provider and lifestyle choices that can promote health and wellness. This includes: °· A yearly physical exam. This may also be called an annual well check. °· Regular dental visits and eye exams. °· Immunizations. °· Screening for certain conditions. °· Healthy lifestyle choices, such as eating a healthy diet, getting regular exercise, not using drugs or products that contain nicotine and tobacco, and limiting alcohol use. °What can I expect for my preventive care visit? °Physical exam °Your health care provider will check your: °· Height and weight. This may be used to calculate body mass index (BMI), which tells if you are at a healthy weight. °· Heart rate and blood pressure. °· Skin for abnormal spots. °Counseling °Your health care provider may ask you questions about your: °· Alcohol, tobacco, and drug use. °· Emotional well-being. °· Home and relationship well-being. °· Sexual activity. °· Eating habits. °· Work and work environment. °· Method of birth control. °· Menstrual cycle. °· Pregnancy history. °What immunizations do I need? ° °Influenza (flu) vaccine °· This is recommended every year. °Tetanus, diphtheria, and pertussis (Tdap) vaccine °· You may need a Td booster every 10 years. °Varicella (chickenpox) vaccine °· You may need this if you have not been vaccinated. °Zoster (shingles) vaccine °· You may need this after age 60. °Measles, mumps, and rubella (MMR) vaccine °· You may need at least one dose of MMR if you were born in 1957 or later. You may also need a second dose. °Pneumococcal conjugate (PCV13) vaccine °· You may need this if you have certain conditions and were not previously vaccinated. °Pneumococcal polysaccharide (PPSV23) vaccine °· You may need one or two doses if you smoke cigarettes or if you have certain conditions. °Meningococcal conjugate (MenACWY) vaccine °· You may need this if you  have certain conditions. °Hepatitis A vaccine °· You may need this if you have certain conditions or if you travel or work in places where you may be exposed to hepatitis A. °Hepatitis B vaccine °· You may need this if you have certain conditions or if you travel or work in places where you may be exposed to hepatitis B. °Haemophilus influenzae type b (Hib) vaccine °· You may need this if you have certain conditions. °Human papillomavirus (HPV) vaccine °· If recommended by your health care provider, you may need three doses over 6 months. °You may receive vaccines as individual doses or as more than one vaccine together in one shot (combination vaccines). Talk with your health care provider about the risks and benefits of combination vaccines. °What tests do I need? °Blood tests °· Lipid and cholesterol levels. These may be checked every 5 years, or more frequently if you are over 50 years old. °· Hepatitis C test. °· Hepatitis B test. °Screening °· Lung cancer screening. You may have this screening every year starting at age 55 if you have a 30-pack-year history of smoking and currently smoke or have quit within the past 15 years. °· Colorectal cancer screening. All adults should have this screening starting at age 50 and continuing until age 75. Your health care provider may recommend screening at age 45 if you are at increased risk. You will have tests every 1-10 years, depending on your results and the type of screening test. °· Diabetes screening. This is done by checking your blood sugar (glucose) after you have not eaten for a while (fasting). You may have this   done every 1-3 years.  Mammogram. This may be done every 1-2 years. Talk with your health care provider about when you should start having regular mammograms. This may depend on whether you have a family history of breast cancer.  BRCA-related cancer screening. This may be done if you have a family history of breast, ovarian, tubal, or peritoneal  cancers.  Pelvic exam and Pap test. This may be done every 3 years starting at age 48. Starting at age 76, this may be done every 5 years if you have a Pap test in combination with an HPV test. Other tests  Sexually transmitted disease (STD) testing.  Bone density scan. This is done to screen for osteoporosis. You may have this scan if you are at high risk for osteoporosis. Follow these instructions at home: Eating and drinking  Eat a diet that includes fresh fruits and vegetables, whole grains, lean protein, and low-fat dairy.  Take vitamin and mineral supplements as recommended by your health care provider.  Do not drink alcohol if: ? Your health care provider tells you not to drink. ? You are pregnant, may be pregnant, or are planning to become pregnant.  If you drink alcohol: ? Limit how much you have to 0-1 drink a day. ? Be aware of how much alcohol is in your drink. In the [place] one drink equals one 12 oz bottle of beer (355 mL), one 5 oz glass of wine (148 mL), or one 1 oz glass of hard liquor (44 mL). Lifestyle  Take daily care of your teeth and gums.  Stay active. Exercise for at least 30 minutes on 5 or more days each week.  Do not use any products that contain nicotine or tobacco, such as cigarettes, e-cigarettes, and chewing tobacco. If you need help quitting, ask your health care provider.  If you are sexually active, practice safe sex. Use a condom or other form of birth control (contraception) in order to prevent pregnancy and STIs (sexually transmitted infections).  If told by your health care provider, take low-dose aspirin daily starting at age 38. What's next?  Visit your health care provider once a year for a well check visit.  Ask your health care provider how often you should have your eyes and teeth checked.  Stay up to date on all vaccines. This information is not intended to replace advice given to you by your health care provider. Make sure you  discuss any questions you have with your health care provider. Document Released: 06/19/2015 Document Revised: 02/01/2018 Document Reviewed: 02/01/2018 Elsevier Patient Education  2020 [place].  Nonalcoholic Fatty Liver Disease Diet, Adult Nonalcoholic fatty liver disease is a condition that causes fat to build up in and around the liver. The disease makes it harder for the liver to work the way that it should. Following a healthy diet can help to keep nonalcoholic fatty liver disease under control. It can also help to prevent or improve conditions that are associated with the disease, such as heart disease, diabetes, high blood pressure, and abnormal cholesterol levels. Along with regular exercise, this diet:  Promotes weight loss.  Helps to control blood sugar levels.  Helps to improve the way that the body uses insulin. What are tips for following this plan? Reading food labels Always check food labels for:  The amount of saturated fat in a food. You should limit your intake of saturated fat. Saturated fat is found in foods that come from animals, including meat and dairy  products such as butter, cheese, and whole milk.  The amount of fiber in a food. You should choose high-fiber foods such as fruits, vegetables, and whole grains. Try to get 25-30 grams (g) of fiber a day.  Cooking  When cooking, use heart-healthy oils that are high in monounsaturated fats. These include olive oil, canola oil, and avocado oil.  Limit frying or deep-frying foods. Cook foods using healthy methods such as baking, boiling, steaming, and grilling instead. Meal planning  You may want to keep track of how many calories you take in. Eating the right amount of calories will help you achieve a healthy weight. Meeting with a registered dietitian can help you get started.  Limit how often you eat takeout and fast food. These foods are usually very high in fat, salt, and sugar.  Use the glycemic index  (GI) to plan your meals. The index tells you how quickly a food will raise your blood sugar. Choose low-GI foods (GI less than 55). These foods take a longer time to raise blood sugar. A registered dietitian can help you identify foods lower on the GI scale. Lifestyle  You may want to follow a Mediterranean diet. This diet includes a lot of vegetables, lean meats or fish, whole grains, fruits, and healthy oils and fats. What foods can I eat?  Fruits Bananas. Apples. Oranges. Grapes. Papaya. Mango. Pomegranate. Kiwi. Grapefruit. Cherries. Vegetables Lettuce. Spinach. Peas. Beets. Cauliflower. Cabbage. Broccoli. Carrots. Tomatoes. Squash. Eggplant. Herbs. Peppers. Onions. Cucumbers. Brussels sprouts. Yams and sweet potatoes. Beans. Lentils. Grains Whole wheat or whole-grain foods, including breads, crackers, cereals, and pasta. Stone-ground whole wheat. Unsweetened oatmeal. Bulgur. Barley. Quinoa. Brown or wild rice. Corn or whole wheat flour tortillas. Meats and other proteins Lean meats. Poultry. Tofu. Seafood and shellfish. Dairy Low-fat or fat-free dairy products, such as yogurt, cottage cheese, or cheese. Beverages Water. Sugar-free drinks. Tea. Coffee. Low-fat or skim milk. Milk alternatives, such as soy or almond milk. Real fruit juice. Fats and oils Avocado. Canola or olive oil. Nuts and nut butters. Seeds. Seasonings and condiments Mustard. Relish. Low-fat, low-sugar ketchup and barbecue sauce. Low-fat or fat-free mayonnaise. Sweets and desserts Sugar-free sweets. The items listed above may not be a complete list of foods and beverages you can eat. Contact a dietitian for more information. What foods should I limit or avoid? Meats and other proteins Limit red meat to 1-2 times a week. Dairy [employer]. Fats and oils Palm oil and coconut oil. Fried foods. Other foods Processed foods. Foods that contain a lot of salt or sodium. Sweets and desserts Sweets that contain  sugar. Beverages Sweetened drinks, such as sweet tea, milkshakes, iced sweet drinks, and sodas. Alcohol. The items listed above may not be a complete list of foods and beverages you should avoid. Contact a dietitian for more information. Where to find more information The [employer] of Diabetes and Digestive and Kidney Diseases: [employer] Summary  Nonalcoholic fatty liver disease is a condition that causes fat to build up in and around the liver.  Following a healthy diet can help to keep nonalcoholic fatty liver disease under control. Your diet should be rich in fruits, vegetables, whole grains, and lean proteins.  Limit your intake of saturated fat. Saturated fat is found in foods that come from animals, including meat and dairy products such as butter, cheese, and whole milk.  This diet promotes weight loss, helps to control blood sugar levels, and helps to improve the way that the body uses  insulin. This information is not intended to replace advice given to you by your health care provider. Make sure you discuss any questions you have with your health care provider. Document Released: 10/07/2014 Document Revised: 09/14/2018 Document Reviewed: 06/14/2018 Elsevier Patient Education  2020 [place].  Fatty Liver Disease  Fatty liver disease occurs when too much fat has built up in your liver cells. Fatty liver disease is also called hepatic steatosis or steatohepatitis. The liver removes harmful substances from your bloodstream and produces fluids that your body needs. It also helps your body use and store energy from the food you eat. In many cases, fatty liver disease does not cause symptoms or problems. It is often diagnosed when tests are being done for other reasons. However, over time, fatty liver can cause inflammation that may lead to more serious liver problems, such as scarring of the liver (cirrhosis) and liver failure. Fatty liver is associated with insulin  resistance, increased body fat, high blood pressure (hypertension), and high cholesterol. These are features of metabolic syndrome and increase your risk for stroke, diabetes, and heart disease. What are the causes? This condition may be caused by:  Drinking too much alcohol.  Poor nutrition.  Obesity.  Cushing's syndrome.  Diabetes.  High cholesterol.  Certain drugs.  Poisons.  Some viral infections.  Pregnancy. What increases the risk? You are more likely to develop this condition if you:  Abuse alcohol.  Are overweight.  Have diabetes.  Have hepatitis.  Have a high triglyceride level.  Are pregnant. What are the signs or symptoms? Fatty liver disease often does not cause symptoms. If symptoms do develop, they can include:  Fatigue.  Weakness.  Weight loss.  Confusion.  Abdominal pain.  Nausea and vomiting.  Yellowing of your skin and the white parts of your eyes (jaundice).  Itchy skin. How is this diagnosed? This condition may be diagnosed by:  A physical exam and medical history.  Blood tests.  Imaging tests, such as an ultrasound, CT scan, or MRI.  A liver biopsy. A small sample of liver tissue is removed using a needle. The sample is then looked at under a microscope. How is this treated? Fatty liver disease is often caused by other health conditions. Treatment for fatty liver may involve medicines and lifestyle changes to manage conditions such as:  Alcoholism.  High cholesterol.  Diabetes.  Being overweight or obese. Follow these instructions at home:   Do not drink alcohol. If you have trouble quitting, ask your health care provider how to safely quit with the help of medicine or a supervised program. This is important to keep your condition from getting worse.  Eat a healthy diet as told by your health care provider. Ask your health care provider about working with a diet and nutrition specialist (dietitian) to develop an  eating plan.  Exercise regularly. This can help you lose weight and control your cholesterol and diabetes. Talk to your health care provider about an exercise plan and which activities are best for you.  Take over-the-counter and prescription medicines only as told by your health care provider.  Keep all follow-up visits as told by your health care provider. This is important. Contact a health care provider if: You have trouble controlling your:  Blood sugar. This is especially important if you have diabetes.  Cholesterol.  Drinking of alcohol. Get help right away if:  You have abdominal pain.  You have jaundice.  You have nausea and vomiting.  You vomit  blood or material that looks like coffee grounds.  You have stools that are black, tar-like, or bloody. Summary  Fatty liver disease develops when too much fat builds up in the cells of your liver.  Fatty liver disease often causes no symptoms or problems. However, over time, fatty liver can cause inflammation that may lead to more serious liver problems, such as scarring of the liver (cirrhosis).  You are more likely to develop this condition if you abuse alcohol, are pregnant, are overweight, have diabetes, have hepatitis, or have high triglyceride levels.  Contact your health care provider if you have trouble controlling your weight, blood sugar, cholesterol, or drinking of alcohol. This information is not intended to replace advice given to you by your health care provider. Make sure you discuss any questions you have with your health care provider. Document Released: 07/08/2005 Document Revised: 05/05/2017 Document Reviewed: 03/01/2017 Elsevier Patient Education  2020 [employer].

## 2018-12-13 ENCOUNTER — Other Ambulatory Visit (INDEPENDENT_AMBULATORY_CARE_PROVIDER_SITE_OTHER): Payer: No Typology Code available for payment source

## 2018-12-13 ENCOUNTER — Other Ambulatory Visit: Payer: Self-pay

## 2018-12-13 ENCOUNTER — Encounter: Payer: No Typology Code available for payment source | Admitting: Family Medicine

## 2018-12-13 ENCOUNTER — Encounter: Payer: Self-pay | Admitting: Family Medicine

## 2018-12-13 ENCOUNTER — Ambulatory Visit
Admission: RE | Admit: 2018-12-13 | Discharge: 2018-12-13 | Disposition: A | Discharge status: Home / Self Care | Payer: No Typology Code available for payment source | Source: Ambulatory Visit | Attending: Family Medicine | Admitting: Family Medicine

## 2018-12-13 DIAGNOSIS — Z1231 Encounter for screening mammogram for malignant neoplasm of breast: Secondary | ICD-10-CM

## 2018-12-13 DIAGNOSIS — K760 Fatty (change of) liver, not elsewhere classified: Secondary | ICD-10-CM | POA: Diagnosis not present

## 2018-12-13 DIAGNOSIS — Z13220 Encounter for screening for lipoid disorders: Secondary | ICD-10-CM

## 2018-12-13 LAB — COMPREHENSIVE METABOLIC PANEL
ALT: 49 U/L — ABNORMAL HIGH (ref 0–35)
AST: 44 U/L — ABNORMAL HIGH (ref 0–37)
Albumin: 4.2 g/dL (ref 3.5–5.2)
Alkaline Phosphatase: 57 U/L (ref 39–117)
BUN: 9 mg/dL (ref 6–23)
CO2: 26 mEq/L (ref 19–32)
Calcium: 9.1 mg/dL (ref 8.4–10.5)
Chloride: 105 mEq/L (ref 96–112)
Creatinine, Ser: 0.92 mg/dL (ref 0.40–1.20)
GFR: 65.05 mL/min (ref >60.00)
Glucose, Bld: 97 mg/dL (ref 70–99)
Potassium: 4.2 mEq/L (ref 3.5–5.1)
Sodium: 138 mEq/L (ref 135–145)
Total Bilirubin: 0.6 mg/dL (ref 0.2–1.2)
Total Protein: 7.5 g/dL (ref 6.0–8.3)

## 2018-12-13 LAB — CBC WITH DIFFERENTIAL/PLATELET
Basophils Absolute: 0 10*3/uL (ref 0.0–0.1)
Basophils Relative: 0.6 % (ref 0.0–3.0)
Eosinophils Absolute: 0.1 10*3/uL (ref 0.0–0.7)
Eosinophils Relative: 1.3 % (ref 0.0–5.0)
HCT: 44.2 % (ref 36.0–46.0)
Hemoglobin: 14.9 g/dL (ref 12.0–15.0)
Lymphocytes Relative: 31.2 % (ref 12.0–46.0)
Lymphs Abs: 2.4 10*3/uL (ref 0.7–4.0)
MCHC: 33.8 g/dL (ref 30.0–36.0)
MCV: 89.4 fl (ref 78.0–100.0)
Monocytes Absolute: 0.8 10*3/uL (ref 0.1–1.0)
Monocytes Relative: 10.5 % (ref 3.0–12.0)
Neutro Abs: 4.3 10*3/uL (ref 1.4–7.7)
Neutrophils Relative %: 56.4 % (ref 43.0–77.0)
Platelets: 355 10*3/uL (ref 150.0–400.0)
RBC: 4.94 Mil/uL (ref 3.87–5.11)
RDW: 13.7 % (ref 11.5–15.5)
WBC: 7.6 10*3/uL (ref 4.0–10.5)

## 2018-12-13 LAB — LIPID PANEL
Cholesterol: 97 mg/dL (ref 0–200)
HDL: 28.4 mg/dL — ABNORMAL LOW (ref >39.00)
LDL Cholesterol: 44 mg/dL (ref 0–99)
NonHDL: 68.95
Total CHOL/HDL Ratio: 3
Triglycerides: 124 mg/dL (ref 0.0–149.0)
VLDL: 24.8 mg/dL (ref 0.0–40.0)

## 2018-12-13 LAB — HEMOGLOBIN A1C: Hgb A1c MFr Bld: 5.7 % (ref 4.6–6.5)

## 2018-12-27 MED FILL — ENBREL SURECLICK 50 MG/ML S: 50 | 30 days supply | Qty: 4 | Fill #2

## 2019-01-03 MED FILL — FOLIC ACID 1 MG TABS: 1 | 90 days supply | Qty: 90 | Fill #0

## 2019-01-03 MED FILL — IPRATROPIUM 0.03% SPRAY: 0.03 | 43 days supply | Qty: 30 | Fill #1

## 2019-01-07 MED FILL — traMADol HCL 50 MG TABS: 50 | 14 days supply | Qty: 28 | Fill #0

## 2019-02-26 MED FILL — IPRATROPIUM 0.03% SPRAY: 0.03 | 43 days supply | Qty: 30 | Fill #1

## 2019-02-26 MED FILL — FOLIC ACID 1 MG TABS: 1 | 90 days supply | Qty: 90 | Fill #0

## 2019-02-26 MED FILL — traMADol HCL 50 MG TABS: 50 | 14 days supply | Qty: 28 | Fill #0

## 2019-02-27 ENCOUNTER — Other Ambulatory Visit: Payer: Self-pay | Admitting: Pharmacist

## 2019-02-27 MED ORDER — ENBREL SURECLICK 50 MG/ML ~~LOC~~ SOAJ: 50.0000 mg | SUBCUTANEOUS | 1 refills | Status: DC

## 2019-02-27 MED FILL — ENBREL SURECLICK 50 MG/ML S: 50 | 28 days supply | Qty: 4 | Fill #0

## 2019-03-28 MED FILL — ENBREL SURECLICK 50 MG/ML S: 50 | 28 days supply | Qty: 4 | Fill #1

## 2019-05-05 IMAGING — DX DG CHEST 2V
2 series · 2 of 2 positions shown · non-contrast
Comparison: 08/22/2012

CLINICAL DATA: Cough for 1 month

EXAM:
CHEST - 2 VIEW

[chest pa]
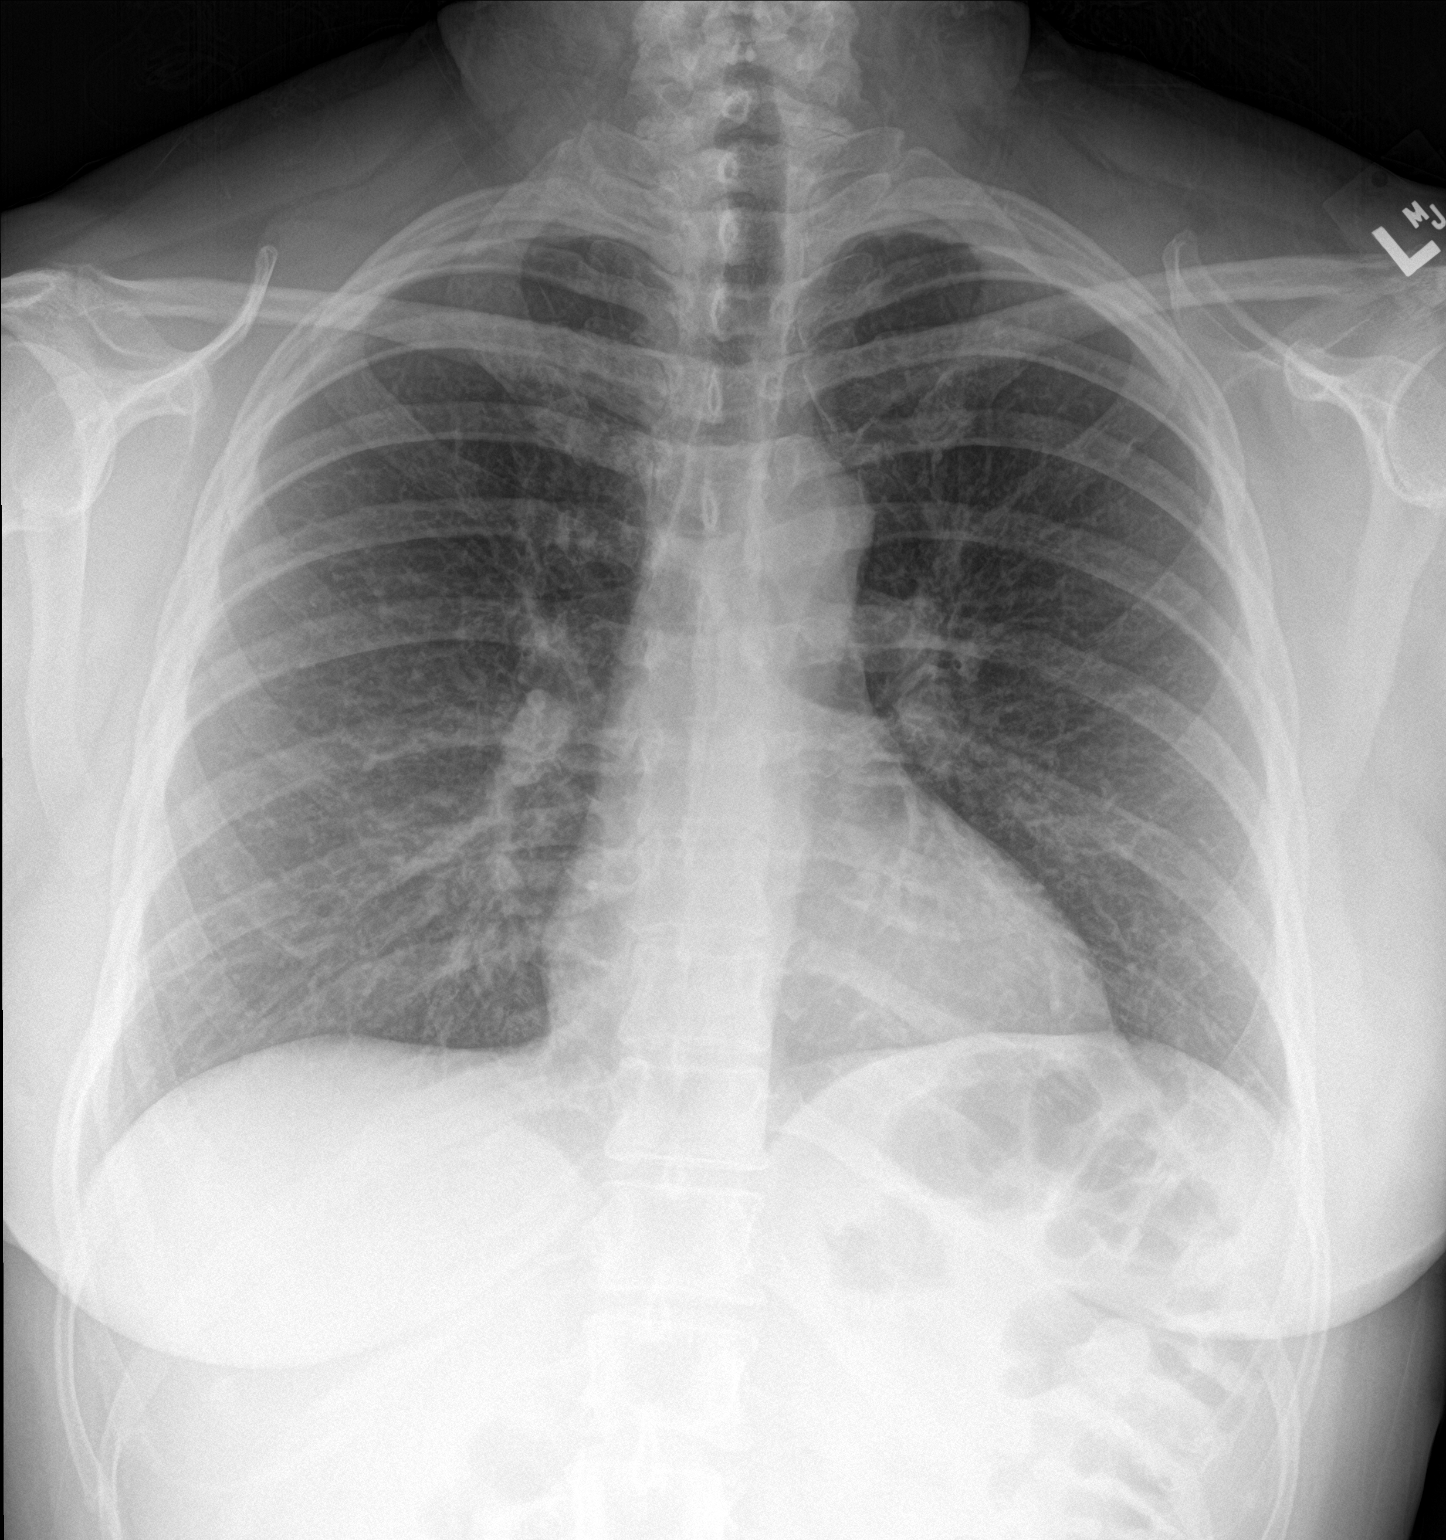

[chest lat]
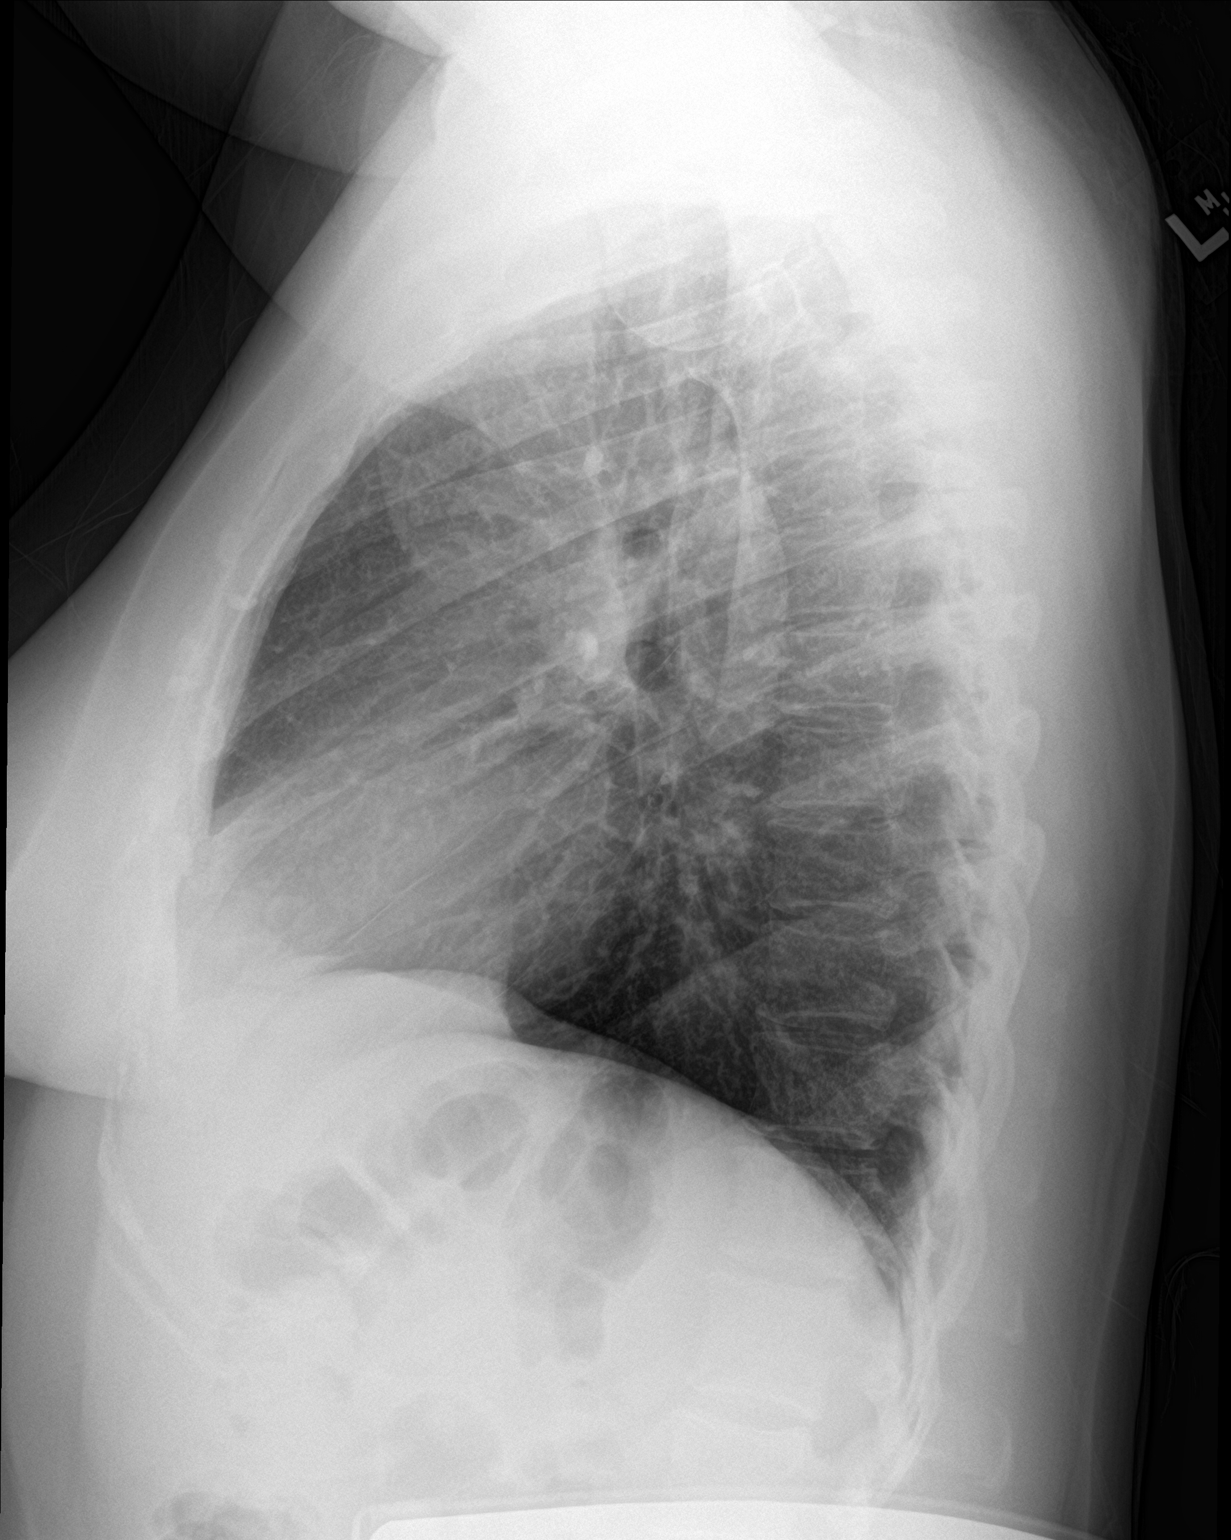

[2 of 2 positions shown; findings below may reference images not displayed]

FINDINGS: The heart size and mediastinal contours are within normal limits.
Both lungs are clear. The visualized skeletal structures are
unremarkable.
IMPRESSION: No active cardiopulmonary disease.

## 2019-05-06 MED FILL — ENBREL SURECLICK 50 MG/ML S: 50 | 28 days supply | Qty: 4 | Fill #0 | Status: TO

## 2019-05-07 ENCOUNTER — Other Ambulatory Visit: Payer: Self-pay | Admitting: Pharmacist

## 2019-05-07 MED ORDER — ENBREL SURECLICK 50 MG/ML ~~LOC~~ SOAJ: 50.0000 mg | SUBCUTANEOUS | 1 refills | Status: DC

## 2019-05-14 MED FILL — ENBREL SURECLICK 50 MG/ML S: 50 | 28 days supply | Qty: 4 | Fill #0

## 2019-06-18 MED FILL — ENBREL SURECLICK 50 MG/ML S: 50 | 28 days supply | Qty: 4 | Fill #1

## 2019-07-16 ENCOUNTER — Other Ambulatory Visit: Payer: Self-pay | Admitting: Pharmacist

## 2019-07-16 MED ORDER — ENBREL SURECLICK 50 MG/ML ~~LOC~~ SOAJ: 50.0000 mg | SUBCUTANEOUS | 0 refills | Status: DC

## 2019-07-18 MED FILL — ENBREL SURECLICK 50 MG/ML S: 50 | 28 days supply | Qty: 4 | Fill #0

## 2019-07-19 MED FILL — traMADol HCL 50 MG TABS: 50 | 14 days supply | Qty: 28 | Fill #0

## 2019-08-09 ENCOUNTER — Other Ambulatory Visit: Payer: Self-pay | Admitting: Internal Medicine

## 2019-08-12 ENCOUNTER — Other Ambulatory Visit: Payer: Self-pay | Admitting: Pharmacist

## 2019-08-12 MED ORDER — ENBREL SURECLICK 50 MG/ML ~~LOC~~ SOAJ: 50.0000 mg | SUBCUTANEOUS | 2 refills | Status: DC

## 2019-08-14 MED FILL — ENBREL SURECLICK 50 MG/ML S: 50 | 28 days supply | Qty: 4 | Fill #0

## 2019-08-28 ENCOUNTER — Other Ambulatory Visit: Payer: Self-pay | Admitting: Family Medicine

## 2019-08-28 DIAGNOSIS — Z1231 Encounter for screening mammogram for malignant neoplasm of breast: Secondary | ICD-10-CM

## 2019-09-18 MED FILL — ENBREL SURECLICK 50 MG/ML S: 50 | 28 days supply | Qty: 4 | Fill #1

## 2019-10-09 ENCOUNTER — Other Ambulatory Visit: Payer: Self-pay

## 2019-10-10 ENCOUNTER — Other Ambulatory Visit: Payer: Self-pay | Admitting: Family Medicine

## 2019-10-10 ENCOUNTER — Encounter: Payer: Self-pay | Admitting: Family Medicine

## 2019-10-10 ENCOUNTER — Ambulatory Visit (INDEPENDENT_AMBULATORY_CARE_PROVIDER_SITE_OTHER): Payer: No Typology Code available for payment source | Admitting: Family Medicine

## 2019-10-10 VITALS — BP 118/80 | HR 67 | Temp 97.0°F | Wt 169.0 lb

## 2019-10-10 DIAGNOSIS — Z131 Encounter for screening for diabetes mellitus: Secondary | ICD-10-CM

## 2019-10-10 DIAGNOSIS — Z13220 Encounter for screening for lipoid disorders: Secondary | ICD-10-CM | POA: Diagnosis not present

## 2019-10-10 DIAGNOSIS — L309 Dermatitis, unspecified: Secondary | ICD-10-CM

## 2019-10-10 DIAGNOSIS — Z0000 Encounter for general adult medical examination without abnormal findings: Secondary | ICD-10-CM | POA: Diagnosis not present

## 2019-10-10 DIAGNOSIS — M0579 Rheumatoid arthritis with rheumatoid factor of multiple sites without organ or systems involvement: Secondary | ICD-10-CM

## 2019-10-10 DIAGNOSIS — E559 Vitamin D deficiency, unspecified: Secondary | ICD-10-CM

## 2019-10-10 DIAGNOSIS — M25472 Effusion, left ankle: Secondary | ICD-10-CM

## 2019-10-10 LAB — COMPREHENSIVE METABOLIC PANEL
ALT: 41 U/L — ABNORMAL HIGH (ref 0–35)
AST: 37 U/L (ref 0–37)
Albumin: 4.4 g/dL (ref 3.5–5.2)
Alkaline Phosphatase: 58 U/L (ref 39–117)
BUN: 8 mg/dL (ref 6–23)
CO2: 25 mEq/L (ref 19–32)
Calcium: 9.2 mg/dL (ref 8.4–10.5)
Chloride: 107 mEq/L (ref 96–112)
Creatinine, Ser: 0.86 mg/dL (ref 0.40–1.20)
GFR: 70.07 mL/min (ref >60.00)
Glucose, Bld: 103 mg/dL — ABNORMAL HIGH (ref 70–99)
Potassium: 4.2 mEq/L (ref 3.5–5.1)
Sodium: 137 mEq/L (ref 135–145)
Total Bilirubin: 0.6 mg/dL (ref 0.2–1.2)
Total Protein: 7.7 g/dL (ref 6.0–8.3)

## 2019-10-10 LAB — HEMOGLOBIN A1C: Hgb A1c MFr Bld: 5.8 % (ref 4.6–6.5)

## 2019-10-10 LAB — LIPID PANEL
Cholesterol: 116 mg/dL (ref 0–200)
HDL: 33.7 mg/dL — ABNORMAL LOW (ref >39.00)
LDL Cholesterol: 63 mg/dL (ref 0–99)
NonHDL: 82.09
Total CHOL/HDL Ratio: 3
Triglycerides: 96 mg/dL (ref 0.0–149.0)
VLDL: 19.2 mg/dL (ref 0.0–40.0)

## 2019-10-10 LAB — CBC WITH DIFFERENTIAL/PLATELET
Basophils Absolute: 0 10*3/uL (ref 0.0–0.1)
Basophils Relative: 0.6 % (ref 0.0–3.0)
Eosinophils Absolute: 0.1 10*3/uL (ref 0.0–0.7)
Eosinophils Relative: 1.3 % (ref 0.0–5.0)
HCT: 45.1 % (ref 36.0–46.0)
Hemoglobin: 15.3 g/dL — ABNORMAL HIGH (ref 12.0–15.0)
Lymphocytes Relative: 38.3 % (ref 12.0–46.0)
Lymphs Abs: 2.8 10*3/uL (ref 0.7–4.0)
MCHC: 33.8 g/dL (ref 30.0–36.0)
MCV: 89.7 fl (ref 78.0–100.0)
Monocytes Absolute: 0.8 10*3/uL (ref 0.1–1.0)
Monocytes Relative: 11.1 % (ref 3.0–12.0)
Neutro Abs: 3.6 10*3/uL (ref 1.4–7.7)
Neutrophils Relative %: 48.7 % (ref 43.0–77.0)
Platelets: 230 10*3/uL (ref 150.0–400.0)
RBC: 5.02 Mil/uL (ref 3.87–5.11)
RDW: 13.9 % (ref 11.5–15.5)
WBC: 7.3 10*3/uL (ref 4.0–10.5)

## 2019-10-10 LAB — VITAMIN D 25 HYDROXY (VIT D DEFICIENCY, FRACTURES): VITD: 24.96 ng/mL — ABNORMAL LOW (ref 30.00–100.00)

## 2019-10-10 LAB — TSH: TSH: 2.26 u[IU]/mL (ref 0.35–4.50)

## 2019-10-10 LAB — FOLATE: Folate: >23.7 ng/mL (ref >5.9)

## 2019-10-10 LAB — T4, FREE: Free T4: 0.72 ng/dL (ref 0.60–1.60)

## 2019-10-10 MED ORDER — VITAMIN D (ERGOCALCIFEROL) 1.25 MG (50000 UNIT) PO CAPS: 50000.0000 [IU] | ORAL_CAPSULE | ORAL | 0 refills | Status: DC

## 2019-10-10 NOTE — Progress Notes (Signed)
Subjective:     Lisa Chase is a 49 y.o. female and is here for a comprehensive physical exam. The patient reports problems - Left ankle edema, rash.  Pt notes recent L ankle edema.  Ankle is not painful and pt does not recall injury.  Seen by Rheumatologist this wk, advised ankle edema 2/2 RA.  Pt s/p steroid injection in L ankle, not sure it is helping.  Pt receiving Embrel for RA.  Pt also notes continued discoloration of R forehead. Tried cortisone cream.  At times area becomes darker and itches.  Denies similar lesions elsewhere.    Pt has been working from home.  She received 2 doses of COVID vaccine.  LMP ongoing. Mammogram scheduled in July.  Social History   Socioeconomic History  . Marital status: Married    Spouse name: Not on file  . Number of children: Not on file  . Years of education: Not on file  . Highest education level: Not on file  Occupational History  . Not on file  Tobacco Use  . Smoking status: Never Smoker  . Smokeless tobacco: Never Used  Substance and Sexual Activity  . Alcohol use: No  . Drug use: No  . Sexual activity: Yes  Other Topics Concern  . Not on file  Social History Narrative  . Not on file   Social Determinants of Health   Financial Resource Strain:   . Difficulty of Paying Living Expenses:   Food Insecurity:   . Worried About Charity fundraiser in the Last Year:   . Arboriculturist in the Last Year:   Transportation Needs:   . Film/video editor (Medical):   Marland Kitchen Lack of Transportation (Non-Medical):   Physical Activity:   . Days of Exercise per Week:   . Minutes of Exercise per Session:   Stress:   . Feeling of Stress :   Social Connections:   . Frequency of Communication with Friends and Family:   . Frequency of Social Gatherings with Friends and Family:   . Attends Religious Services:   . Active Member of Clubs or Organizations:   . Attends Archivist Meetings:   Marland Kitchen Marital Status:   Intimate  Partner Violence:   . Fear of Current or Ex-Partner:   . Emotionally Abused:   Marland Kitchen Physically Abused:   . Sexually Abused:    Health Maintenance  Topic Date Due  . HIV Screening  Never done  . TETANUS/TDAP  Never done  . PAP SMEAR-Modifier  Never done  . INFLUENZA VACCINE  01/05/2020    The following portions of the patient's history were reviewed and updated as appropriate: allergies, current medications, past family history, past medical history, past social history, past surgical history and problem list.  Review of Systems Pertinent items noted in HPI and remainder of comprehensive ROS otherwise negative.   Objective:    BP 118/80 (BP Location: Left Arm, Patient Position: Sitting, Cuff Size: Normal)   Pulse 67   Temp (!) 97 F (36.1 C) (Temporal)   Wt 169 lb (76.7 kg)   LMP 10/10/2019 (Exact Date)   SpO2 98%   BMI 29.94 kg/m  General appearance: alert, cooperative and no distress Head: Normocephalic, without obvious abnormality, atraumatic Eyes: conjunctivae/corneas clear. PERRL, EOM's intact. Fundi benign. Ears: normal TM's and external ear canals both ears Nose: Nares normal. Septum midline. Mucosa normal. No drainage or sinus tenderness. Throat: lips, mucosa, and tongue normal; teeth and gums normal  Neck: no adenopathy, no carotid bruit, no JVD, supple, symmetrical, trachea midline and thyroid not enlarged, symmetric, no tenderness/mass/nodules Lungs: clear to auscultation bilaterally Heart: regular rate and rhythm, S1, S2 normal, no murmur, click, rub or gallop Abdomen: soft, non-tender; bowel sounds normal; no masses,  no organomegaly Extremities: extremities normal, atraumatic, no cyanosis or edema  Mild deformities and edema of MTP and PIP joints of b/l hands Pulses: 2+ and symmetric Skin: Skin color, texture, turgor normal. hyperpigemented plaque on R forehead, rough in appearance however not dry or flaky.   Lymph nodes: Cervical, supraclavicular, and axillary  nodes normal. Neurologic: Alert and oriented X 3, normal strength and tone. Normal symmetric reflexes. Normal coordination and gait    Assessment:    Healthy female exam with RA, hyperpigmented plaque on froeheand and L ankle edema.     Plan:     Anticipatory guidance given including wearing seatbelts, smoke detectors in the home, increasing physical activity, increasing p.o. intake of water and vegetables. -will obtain labs -pt wishes to have pap done after menses. -mammogram scheduled for July 2021. -given handout -next CPE in 1 yr See After Visit Summary for Counseling Recommendations    Rheumatoid arthritis involving multiple sites with positive rheumatoid factor (HCC)  -continue Embrel -continue f/u with Rheumatology - Plan: Comprehensive metabolic panel, Folate, Vitamin D, 25-hydroxy  Dermatitis  - Plan: Ambulatory referral to Dermatology, TSH, T4, Free, Hemoglobin A1c  Ankle edema, Left -likely 2/2 RA -s/p steroid injection with Rheumatology -discussed to expect after steroid injection -Discussed ROM exercises -continue supportive care -continue f/u with Rheumatology  Screening for cholesterol level  - Plan: Lipid panel  Screening for diabetes mellitus  - Plan: Hemoglobin A1c  F/u prn  Abbe Amsterdam, MD

## 2019-10-10 NOTE — Patient Instructions (Signed)

## 2019-10-11 ENCOUNTER — Other Ambulatory Visit: Payer: Self-pay

## 2019-10-11 DIAGNOSIS — E559 Vitamin D deficiency, unspecified: Secondary | ICD-10-CM

## 2019-10-11 MED ORDER — VITAMIN D (ERGOCALCIFEROL) 1.25 MG (50000 UNIT) PO CAPS: 50000.0000 [IU] | ORAL_CAPSULE | ORAL | 0 refills | Status: DC

## 2019-10-11 MED FILL — VIT D2 1.25 MG (50,000 UNIT: 1.25 MG | 84 days supply | Qty: 12 | Fill #0

## 2019-11-26 MED FILL — ENBREL SURECLICK 50 MG/ML S: 50 | 28 days supply | Qty: 4 | Fill #2

## 2019-12-16 ENCOUNTER — Other Ambulatory Visit: Payer: Self-pay

## 2019-12-16 ENCOUNTER — Ambulatory Visit
Admission: RE | Admit: 2019-12-16 | Discharge: 2019-12-16 | Disposition: A | Discharge status: Home / Self Care | Payer: No Typology Code available for payment source | Source: Ambulatory Visit | Attending: Family Medicine | Admitting: Family Medicine

## 2019-12-16 ENCOUNTER — Ambulatory Visit (HOSPITAL_BASED_OUTPATIENT_CLINIC_OR_DEPARTMENT_OTHER): Payer: No Typology Code available for payment source | Admitting: Pharmacist

## 2019-12-16 DIAGNOSIS — Z1231 Encounter for screening mammogram for malignant neoplasm of breast: Secondary | ICD-10-CM

## 2019-12-16 DIAGNOSIS — Z79899 Other long term (current) drug therapy: Secondary | ICD-10-CM

## 2019-12-16 NOTE — Progress Notes (Signed)
  S: Patient presents for review of their specialty medication therapy.  Patient is currently taking Enbrel for RA. Patient is managed by Dr. Janyth Contes for this.  Dosing: RA - SubQ: Note: May continue methotrexate, glucocorticoids, salicylates, NSAIDs, or analgesics during etanercept therapy. Once-weekly dosing: 50 mg once weekly  Monitoring: Injection site reactions: reports occasional swelling/redness; resolves quickly and rheumatologist is aware S/sx of infections: denies  S/sx of malignancy: denies GI upset: denies   O: Lab Results  Component Value Date   WBC 7.3 10/10/2019   HGB 15.3 (H) 10/10/2019   HCT 45.1 10/10/2019   MCV 89.7 10/10/2019   PLT 230.0 10/10/2019      Chemistry      Component Value Date/Time   NA 137 10/10/2019 1004   K 4.2 10/10/2019 1004   CL 107 10/10/2019 1004   CO2 25 10/10/2019 1004   BUN 8 10/10/2019 1004   CREATININE 0.86 10/10/2019 1004   CREATININE 0.92 08/22/2012 2053      Component Value Date/Time   CALCIUM 9.2 10/10/2019 1004   ALKPHOS 58 10/10/2019 1004   AST 37 10/10/2019 1004   ALT 41 (H) 10/10/2019 1004   BILITOT 0.6 10/10/2019 1004     A/P: 1. Medication review: patient currently on Enbrel for RA and is tolerating it. Reviewed the medication with the patient, including the following: Enbrel (etanercept) binds tumor necrosis factor (TNF) and blocks its interaction with cell surface receptors. TNF plays an important role in the inflammatory processes of many diseases. Patient educated on purpose, proper use and potential adverse effects of Enbrel. Adverse effects include rash, GI upset, increased risk of infection, and injection site reactions. Patients should stop Enbrel if they develop a serious infection. There is a possible increased risk in lymphoma and other malignancies. No recommendations for any changes.   Butch Penny, PharmD, CPP Clinical Pharmacist [facility] [phone]

## 2019-12-17 ENCOUNTER — Encounter: Payer: Self-pay | Admitting: Family Medicine

## 2019-12-17 ENCOUNTER — Telehealth (INDEPENDENT_AMBULATORY_CARE_PROVIDER_SITE_OTHER): Payer: No Typology Code available for payment source | Admitting: Family Medicine

## 2019-12-17 DIAGNOSIS — R05 Cough: Secondary | ICD-10-CM

## 2019-12-17 DIAGNOSIS — R059 Cough, unspecified: Secondary | ICD-10-CM

## 2019-12-17 DIAGNOSIS — R0981 Nasal congestion: Secondary | ICD-10-CM | POA: Diagnosis not present

## 2019-12-17 DIAGNOSIS — R062 Wheezing: Secondary | ICD-10-CM | POA: Diagnosis not present

## 2019-12-17 MED ORDER — BENZONATATE 100 MG PO CAPS
100.0000 mg | ORAL_CAPSULE | Freq: Three times a day (TID) | ORAL | 0 refills | Status: DC | PRN
Start: 2019-12-17 — End: 2020-01-02

## 2019-12-17 MED ORDER — ALBUTEROL SULFATE HFA 108 (90 BASE) MCG/ACT IN AERS: 1.0000 | INHALATION_SPRAY | Freq: Four times a day (QID) | RESPIRATORY_TRACT | 0 refills | Status: DC | PRN

## 2019-12-17 NOTE — Progress Notes (Signed)
Virtual Visit via Telephone Note  I connected with Lisa Chase on 12/17/19 at  1:20 PM EDT by telephone and verified that I am speaking with the correct person using two identifiers.   I discussed the limitations, risks, security and privacy concerns of performing an evaluation and management service by telephone and the availability of in person appointments. I also discussed with the patient that there may be a patient responsible charge related to this service. The patient expressed understanding and agreed to proceed.  Location patient: home,  Location provider: work or home office Participants present for the call: patient, provider Patient did not have a visit in the prior 7 days to address this/these issue(s).   History of Present Illness:  Acute visit for a cough and congestion: -started about 4 days ago -symptoms include nasal congestion, dry cough, HA, some mild SOB - more when she is coughing, mild wheezing, sore throat - has tried ginger tea, robitussin, cough drops -she is calling to get a cough medication -denies fever, CP, NVD, loss of taste -Temp 97.5 -no known sick contacts -Fully vaccine for COVID19 with pfizer vaccine -denies history of asthma or lung disease, but reports has had similar symptoms with weather changes - and has required proair in the past  Observations/Objective: Patient sounds cheerful and well on the phone. I do not appreciate any SOB. Speech and thought processing are grossly intact. Patient reported vitals:  Assessment and Plan:  Cough  Nasal congestion  Wheezing  -we discussed possible serious and likely etiologies, options for evaluation and workup, limitations of telemedicine visit vs in person visit, treatment, treatment risks and precautions. Pt prefers to treat via telemedicine empirically rather then risking or undertaking an in person visit at this moment. Query viral URI, possible bronchitis vs other. Discussed  that covid19 is less likely given she is fully vaccinated, but not 100% impossible. Discussed staying home while sick and testing/treatment options given her PMH. Opted for treatment with alb 1-2 puffs q4-6 hours prn and tessalon for cough. Advised low threshhold given PMH for prompt follow up with PCP office or inperson care if worsening, new symptoms arise, or if is not improving with treatment. She declined a work note.   Follow Up Instructions:   I did not refer this patient for an OV in the next 24 hours for this/these issue(s).  I discussed the assessment and treatment plan with the patient. The patient was provided an opportunity to ask questions and all were answered. The patient agreed with the plan and demonstrated an understanding of the instructions.   The patient was advised to call back or seek an in-person evaluation if the symptoms worsen or if the condition fails to improve as anticipated.  I provided 15 minutes of non-face-to-face time during this encounter.   Terressa Koyanagi, DO

## 2019-12-31 ENCOUNTER — Other Ambulatory Visit: Payer: Self-pay | Admitting: Pharmacist

## 2019-12-31 MED ORDER — ENBREL SURECLICK 50 MG/ML ~~LOC~~ SOAJ: 50.0000 mg | SUBCUTANEOUS | 2 refills | Status: DC

## 2020-01-01 MED FILL — ENBREL SURECLICK 50 MG/ML S: 50 | 28 days supply | Qty: 4 | Fill #0

## 2020-01-02 ENCOUNTER — Ambulatory Visit (INDEPENDENT_AMBULATORY_CARE_PROVIDER_SITE_OTHER): Payer: No Typology Code available for payment source | Admitting: Physician Assistant

## 2020-01-02 ENCOUNTER — Other Ambulatory Visit: Payer: Self-pay

## 2020-01-02 ENCOUNTER — Encounter: Payer: Self-pay | Admitting: Physician Assistant

## 2020-01-02 DIAGNOSIS — L811 Chloasma: Secondary | ICD-10-CM

## 2020-01-02 MED ORDER — TRETINOIN 0.025 % EX CREA: TOPICAL_CREAM | Freq: Every evening | CUTANEOUS | 2 refills | Status: AC

## 2020-01-02 MED ORDER — TRETINOIN 0.025 % EX CREA: TOPICAL_CREAM | Freq: Every evening | CUTANEOUS | 2 refills | Status: DC

## 2020-01-02 NOTE — Progress Notes (Signed)
   New Patient Visit  Subjective  Lisa Chase is a 49 y.o. female who presents for the following: Skin Problem (Has a darker area on right side forehead. x couple years. Does itch some. Patients PCP said to try hydrocortisone, but it did not work. ). She takes 2 long walks every day and does not wear a hat or sunscreen.  Objective  Well appearing patient in no apparent distress; mood and affect are within normal limits.  Face examined. Relevant physical exam findings are noted in the Assessment and Plan.  Objective  Head - Anterior (Face): Reticulated hyperpigmented patches. Right greater than left forehead.  Assessment & Plan  Melasma Head - Anterior (Face)  She will use OTC hydroquinone qam. We also discussed sun protection  Ordered Medications: tretinoin (RETIN-A) 0.025 % cream

## 2020-01-27 MED FILL — ENBREL SURECLICK 50 MG/ML S: 50 | 28 days supply | Qty: 4 | Fill #1

## 2020-02-26 ENCOUNTER — Ambulatory Visit: Payer: No Typology Code available for payment source | Admitting: Physician Assistant

## 2020-02-26 MED FILL — ENBREL SURECLICK 50 MG/ML S: 50 | 28 days supply | Qty: 4 | Fill #2

## 2020-03-20 ENCOUNTER — Other Ambulatory Visit: Payer: Self-pay | Admitting: Internal Medicine

## 2020-03-23 ENCOUNTER — Other Ambulatory Visit: Payer: Self-pay | Admitting: Pharmacist

## 2020-03-23 MED ORDER — ENBREL SURECLICK 50 MG/ML ~~LOC~~ SOAJ: SUBCUTANEOUS | 2 refills | Status: DC

## 2020-03-25 MED FILL — ENBREL SURECLICK 50 MG/ML S: 50 | 28 days supply | Qty: 4 | Fill #0

## 2020-04-01 ENCOUNTER — Other Ambulatory Visit (HOSPITAL_COMMUNITY): Payer: Self-pay | Admitting: Rheumatology

## 2020-04-01 MED FILL — traMADol HCL 50 MG TABS: 50 | 14 days supply | Qty: 28 | Fill #0

## 2020-05-27 MED FILL — ENBREL SURECLICK 50 MG/ML S: 50 | 28 days supply | Qty: 4 | Fill #1

## 2020-06-15 ENCOUNTER — Encounter: Payer: Self-pay | Admitting: Family Medicine

## 2020-06-15 ENCOUNTER — Telehealth (INDEPENDENT_AMBULATORY_CARE_PROVIDER_SITE_OTHER): Payer: No Typology Code available for payment source | Admitting: Family Medicine

## 2020-06-15 DIAGNOSIS — R059 Cough, unspecified: Secondary | ICD-10-CM

## 2020-06-15 MED ORDER — IPRATROPIUM BROMIDE 0.03 % NA SOLN: 2.0000 | Freq: Two times a day (BID) | NASAL | 1 refills | Status: DC

## 2020-06-15 MED ORDER — BENZONATATE 100 MG PO CAPS: 100.0000 mg | ORAL_CAPSULE | Freq: Two times a day (BID) | ORAL | 0 refills | Status: DC | PRN

## 2020-06-15 MED ORDER — HYDROCODONE-HOMATROPINE 5-1.5 MG/5ML PO SYRP: 5.0000 mL | ORAL_SOLUTION | Freq: Three times a day (TID) | ORAL | 0 refills | Status: DC | PRN

## 2020-06-15 NOTE — Progress Notes (Signed)
Virtual Visit via Video Note  I connected with Lisa Chase on 06/15/20 at  1:30 PM EST by a video enabled telemedicine application 2/2 COVID-19 pandemic and verified that I am speaking with the correct person using two identifiers.  Location patient: home Location provider:work or home office Persons participating in the virtual visit: patient, provider  I discussed the limitations of evaluation and management by telemedicine and the availability of in person appointments. The patient expressed understanding and agreed to proceed.   HPI: Pt is a 50 yo female with pmh sig for RA on Methotrexate, and GERD who was seen for acute concern.  Pt endorses dry cough since Thursday.  Cough keeping pt up at night.  Denies fever, HA, ear pain or pressure, n/v, chills. Pt notes occasional sore throat mostly from coughing.  Patient may have had frontal pressure when symptoms started but denies any current facial pain or pressure.  Pt tried theraflu tea, Robitussin DM.  Pt denies sick contacts.  States her husband got she got him sick so he got COVID testing which was negative.  Patient states she typically gets symptoms similar to this each year.  At times it turns into bronchitis.  Does not feel like she needs an inhaler at this time.  ROS: See pertinent positives and negatives per HPI.  Past Medical History:  Diagnosis Date  . Arthritis    Rheumatoid dx 2010 on Methotrexate  . GERD (gastroesophageal reflux disease)     History reviewed. No pertinent surgical history.  Family History  Problem Relation Age of Onset  . Heart disease Mother       Current Outpatient Medications:  .  etanercept (ENBREL SURECLICK) 50 MG/ML injection, Inject 50 mg subcutaneous once weekly 30 days., Disp: 4 mL, Rfl: 2 .  folic acid (FOLVITE) 1 MG tablet, Take 1 mg by mouth every other day. , Disp: , Rfl:  .  Pediatric Multivitamins-Fl (MULT-VITAMIN/FLUORIDE PO), , Disp: , Rfl:  .  traMADol (ULTRAM) 50 MG tablet,  Take 50 mg by mouth every 6 (six) hours as needed., Disp: , Rfl:  .  tretinoin (RETIN-A) 0.025 % cream, Apply topically at bedtime., Disp: 45 g, Rfl: 2  EXAM:  VITALS per patient if applicable: RR between 12-20 bpm  GENERAL: alert, oriented, appears well and in no acute distress  HEENT: atraumatic, conjunctiva clear, no obvious abnormalities on inspection of external nose and ears  NECK: normal movements of the head and neck  LUNGS: on inspection no signs of respiratory distress, breathing rate appears normal, no obvious gross SOB, gasping or wheezing  CV: no obvious cyanosis  MS: moves all visible extremities without noticeable abnormality  PSYCH/NEURO: pleasant and cooperative, no obvious depression or anxiety, speech and thought processing grossly intact  ASSESSMENT AND PLAN:  Discussed the following assessment and plan:  Cough -Likely viral -Continue supportive care -Consider COVID testing for continued or worsening symptoms -For continued or worsening cough consider inhaler - Plan: benzonatate (TESSALON) 100 MG capsule, HYDROcodone-homatropine (HYCODAN) 5-1.5 MG/5ML syrup, ipratropium (ATROVENT) 0.03 % nasal spray  Follow-up as needed   I discussed the assessment and treatment plan with the patient. The patient was provided an opportunity to ask questions and all were answered. The patient agreed with the plan and demonstrated an understanding of the instructions.   The patient was advised to call back or seek an in-person evaluation if the symptoms worsen or if the condition fails to improve as anticipated.  Deeann Saint, MD

## 2020-06-23 MED FILL — ENBREL SURECLICK 50 MG/ML S: 50 | 28 days supply | Qty: 4 | Fill #2

## 2020-07-27 MED FILL — ENBREL SURECLICK 50 MG/ML S: 50 | 28 days supply | Qty: 4 | Fill #0

## 2020-08-03 ENCOUNTER — Other Ambulatory Visit: Payer: Self-pay | Admitting: Pharmacist

## 2020-08-03 MED ORDER — ENBREL SURECLICK 50 MG/ML ~~LOC~~ SOAJ: SUBCUTANEOUS | 1 refills | Status: DC

## 2020-08-25 MED FILL — ENBREL SURECLICK 50 MG/ML S: 50 | 28 days supply | Qty: 4 | Fill #0

## 2020-08-31 ENCOUNTER — Other Ambulatory Visit: Payer: Self-pay | Admitting: Family Medicine

## 2020-08-31 DIAGNOSIS — Z1231 Encounter for screening mammogram for malignant neoplasm of breast: Secondary | ICD-10-CM

## 2020-09-03 ENCOUNTER — Other Ambulatory Visit (HOSPITAL_COMMUNITY): Payer: Self-pay

## 2020-09-21 ENCOUNTER — Other Ambulatory Visit (HOSPITAL_COMMUNITY): Payer: Self-pay

## 2020-09-23 ENCOUNTER — Other Ambulatory Visit (HOSPITAL_COMMUNITY): Payer: Self-pay

## 2020-09-30 ENCOUNTER — Other Ambulatory Visit (HOSPITAL_COMMUNITY): Payer: Self-pay

## 2020-09-30 MED FILL — Etanercept Subcutaneous Solution Auto-injector 50 MG/ML: SUBCUTANEOUS | 28 days supply | Qty: 4 | Fill #0 | Status: AC

## 2020-10-01 ENCOUNTER — Other Ambulatory Visit (HOSPITAL_COMMUNITY): Payer: Self-pay

## 2020-10-04 ENCOUNTER — Other Ambulatory Visit: Payer: Self-pay

## 2020-10-04 ENCOUNTER — Ambulatory Visit (HOSPITAL_COMMUNITY)
Admission: EM | Admit: 2020-10-04 | Discharge: 2020-10-04 | Disposition: A | Discharge status: Home / Self Care | Payer: No Typology Code available for payment source

## 2020-10-04 ENCOUNTER — Encounter (HOSPITAL_COMMUNITY): Payer: Self-pay

## 2020-10-04 DIAGNOSIS — J40 Bronchitis, not specified as acute or chronic: Secondary | ICD-10-CM

## 2020-10-04 DIAGNOSIS — J069 Acute upper respiratory infection, unspecified: Secondary | ICD-10-CM | POA: Diagnosis not present

## 2020-10-04 MED ORDER — ALBUTEROL SULFATE (5 MG/ML) 0.5% IN NEBU: 2.5000 mg | INHALATION_SOLUTION | Freq: Four times a day (QID) | RESPIRATORY_TRACT | 12 refills | Status: DC | PRN

## 2020-10-04 MED ORDER — PROMETHAZINE-DM 6.25-15 MG/5ML PO SYRP: 5.0000 mL | ORAL_SOLUTION | Freq: Four times a day (QID) | ORAL | 0 refills | Status: DC | PRN

## 2020-10-04 MED ORDER — BENZONATATE 100 MG PO CAPS: 200.0000 mg | ORAL_CAPSULE | Freq: Three times a day (TID) | ORAL | 0 refills | Status: DC | PRN

## 2020-10-04 NOTE — Discharge Instructions (Addendum)
You most likely have viral illness and bronchitis.    You can take Tylenol and/or Ibuprofen as needed for fever reduction and pain relief.   Use Flonase 2 sprays in each nostril daily or Nasacort.  You can use the tessalon Perles as needed for cough.  You can also use the Promethazine DM as needed for cough at night.   Use the nebulizer as needed for shortness of breath and wheezing.    For cough: honey 1/2 to 1 teaspoon (you can dilute the honey in water or another fluid).  You can use a humidifier for chest congestion and cough.  If you don't have a humidifier, you can sit in the bathroom with the hot shower running.    For sore throat: try warm salt water gargles, cepacol lozenges, throat spray, warm tea or water with lemon/honey, popsicles or ice, or OTC cold relief medicine for throat discomfort.    For congestion: take a daily anti-histamine like Zyrtec, Claritin, and a oral decongestant to help with post nasal drip that may be irritating your throat.    It is important to stay hydrated: drink plenty of fluids (water, gatorade/powerade/pedialyte, juices, or teas) to keep your throat moisturized and help further relieve irritation/discomfort.   Return or go to the Emergency Department if symptoms worsen or do not improve in the next few days.

## 2020-10-04 NOTE — ED Triage Notes (Addendum)
Pt reports persistant cough since Thursday. Reports coughing spells make it hard to breathe but denied shob at this time. Also reports headaches. Pt took home covid test this morning which was negative.

## 2020-10-04 NOTE — ED Provider Notes (Signed)
[facility]    CSN: [id number] Arrival date & time: 10/04/20  1014      History   Chief Complaint Chief Complaint  Patient presents with  . Cough  . Headache    HPI Lisa Chase is a 50 y.o. female.   Patient here for evaluation of cough and shortness of breath that has been ongoing since Thursday.  Patient does report having chills but denies any fevers.  Reports having similar symptoms every year around this time of year.  Reports taking some Tessalon Perles and cough syrup that she had leftover from last time she had this with minimal relief.  Reports taking prescription medications as prescribed but no other OTC medications or treatments. Denies any trauma, injury, or other precipitating event.  Denies any specific alleviating or aggravating factors.  Denies any fevers, chest pain, shortness of breath, N/V/D, numbness, tingling, weakness, abdominal pain, or headaches.   ROS: As per HPI, all other pertinent ROS negative   The history is provided by the patient.  Cough Associated symptoms: chills and headaches   Associated symptoms: no fever   Headache Associated symptoms: cough   Associated symptoms: no fever     Past Medical History:  Diagnosis Date  . Arthritis    Rheumatoid dx 2010 on Methotrexate  . GERD (gastroesophageal reflux disease)     Patient Active Problem List   Diagnosis Date Noted  . Medication management 11/14/2017  . Rheumatoid arthritis (HCC) 01/28/2009  . 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 No pertinent surgical history.  OB History   No obstetric history on file.      Home Medications    Prior to Admission medications   Medication Sig Start Date End Date Taking? Authorizing Provider  albuterol (PROVENTIL) (5 MG/ML) 0.5% nebulizer solution Take 0.5 mLs (2.5 mg total) by nebulization every 6 (six) hours as needed for wheezing or shortness of breath. 10/04/20  Yes Ivette Loyal, NP  benzonatate (TESSALON  PERLES) 100 MG capsule Take 2 capsules (200 mg total) by mouth 3 (three) times daily as needed for cough. 10/04/20  Yes Ivette Loyal, NP  etanercept (ENBREL) 50 MG/ML injection INJECT 50 MG SUBCUTANEOUS ONCE WEEKLY. 08/03/20 08/03/21 Yes Quentin Angst, MD  folic acid (FOLVITE) 1 MG tablet Take 1 mg by mouth every other day.    Yes [provider]  ipratropium (ATROVENT) 0.03 % nasal spray Place 2 sprays into both nostrils every 12 (twelve) hours. 06/15/20  Yes Deeann Saint, MD  naproxen (NAPROSYN) 500 MG tablet Take 500 mg by mouth as needed.   Yes [provider]  Pediatric Multivitamins-Fl (MULT-VITAMIN/FLUORIDE PO)    Yes [provider]  promethazine-dextromethorphan (PROMETHAZINE-DM) 6.25-15 MG/5ML syrup Take 5 mLs by mouth 4 (four) times daily as needed for cough. 10/04/20  Yes Ivette Loyal, NP  HYDROcodone-homatropine (HYCODAN) 5-1.5 MG/5ML syrup Take 5 mLs by mouth every 8 (eight) hours as needed for cough. 06/15/20   Deeann Saint, MD  traMADol (ULTRAM) 50 MG tablet Take 50 mg by mouth every 6 (six) hours as needed.    [provider]  tretinoin (RETIN-A) 0.025 % cream Apply topically at bedtime. 01/02/20 01/01/21  Clark-Burning, Victorino Dike, PA-C    Family History Family History  Problem Relation Age of Onset  . Heart disease Mother     Social History Social History   Tobacco Use  . Smoking status: Never Smoker  . Smokeless tobacco: Never Used  Substance  Use Topics  . Alcohol use: No  . Drug use: No     Allergies   Patient has no known allergies.   Review of Systems Review of Systems  Constitutional: Positive for chills. Negative for fever.  Respiratory: Positive for cough.   Neurological: Positive for headaches.  All other systems reviewed and are negative.    Physical Exam Triage Vital Signs ED Triage Vitals  Enc Vitals Group     BP 10/04/20 1104 139/84     Pulse Rate 10/04/20 1104 74     Resp 10/04/20 1104 20      Temp 10/04/20 1104 98.9 F (37.2 C)     Temp src --      SpO2 10/04/20 1104 96 %     Weight --      Height --      Head Circumference --      Peak Flow --      Pain Score 10/04/20 1059 3     Pain Loc --      Pain Edu? --      Excl. in GC? --    No data found.  Updated Vital Signs BP 139/84   Pulse 74   Temp 98.9 F (37.2 C)   Resp 20   LMP 08/04/2020 Comment: pt reports irregular periods since last year.  SpO2 96%   Visual Acuity Right Eye Distance:   Left Eye Distance:   Bilateral Distance:    Right Eye Near:   Left Eye Near:    Bilateral Near:     Physical Exam Vitals and nursing note reviewed.  Constitutional:      General: She is not in acute distress.    Appearance: Normal appearance. She is not ill-appearing, toxic-appearing or diaphoretic.  HENT:     Head: Normocephalic and atraumatic.     Mouth/Throat:     Pharynx: Oropharynx is clear. Uvula midline.     Tonsils: No tonsillar exudate or tonsillar abscesses. 0 on the right. 0 on the left.  Eyes:     Conjunctiva/sclera: Conjunctivae normal.  Cardiovascular:     Rate and Rhythm: Normal rate.     Pulses: Normal pulses.     Heart sounds: Normal heart sounds.  Pulmonary:     Effort: Pulmonary effort is normal.     Breath sounds: Normal breath sounds.  Abdominal:     General: Abdomen is flat.     Palpations: Abdomen is soft.  Musculoskeletal:        General: Normal range of motion.     Cervical back: Normal range of motion.  Skin:    General: Skin is warm and dry.  Neurological:     General: No focal deficit present.     Mental Status: She is alert and oriented to person, place, and time.     GCS: GCS eye subscore is 4. GCS verbal subscore is 5. GCS motor subscore is 6.  Psychiatric:        Mood and Affect: Mood normal.      UC Treatments / Results  Labs (all labs ordered are listed, but only abnormal results are displayed) Labs Reviewed - No data to display  EKG   Radiology No results  found.  Procedures Procedures (including critical care time)  Medications Ordered in UC Medications - No data to display  Initial Impression / Assessment and Plan / UC Course  I have reviewed the triage vital signs and the nursing notes.  Pertinent labs & imaging  results that were available during my care of the patient were reviewed by me and considered in my medical decision making (see chart for details).     Assessment negative for red flags or concerns.  This is most likely a viral URI or bronchitis.  Recommend adding Flonase 2 sprays in each nostril daily.  Recommend taking a Claritin or Zyrtec daily.  May take Tessalon Perles and Promethazine DM as needed for cough.  Patient has been using inhaler with minimal relief.  Patient prescribed nebulizer for shortness of breath and wheezing.  Discussed conservative management as described below in discharge instructions.  May take Tylenol and/or ibuprofen as needed for pain relief and fever reduction.  Follow-up with primary care if symptoms do not improve.  Final Clinical Impressions(s) / UC Diagnoses   Final diagnoses:  Bronchitis  Viral URI with cough     Discharge Instructions     You most likely have viral illness and bronchitis.    You can take Tylenol and/or Ibuprofen as needed for fever reduction and pain relief.   Use Flonase 2 sprays in each nostril daily or Nasacort.  You can use the tessalon Perles as needed for cough.  You can also use the Promethazine DM as needed for cough at night.   Use the nebulizer as needed for shortness of breath and wheezing.    For cough: honey 1/2 to 1 teaspoon (you can dilute the honey in water or another fluid).  You can use a humidifier for chest congestion and cough.  If you don't have a humidifier, you can sit in the bathroom with the hot shower running.    For sore throat: try warm salt water gargles, cepacol lozenges, throat spray, warm tea or water with lemon/honey, popsicles or  ice, or OTC cold relief medicine for throat discomfort.    For congestion: take a daily anti-histamine like Zyrtec, Claritin, and a oral decongestant to help with post nasal drip that may be irritating your throat.    It is important to stay hydrated: drink plenty of fluids (water, gatorade/powerade/pedialyte, juices, or teas) to keep your throat moisturized and help further relieve irritation/discomfort.   Return or go to the Emergency Department if symptoms worsen or do not improve in the next few days.     ED Prescriptions    Medication Sig Dispense Auth. Provider   benzonatate (TESSALON PERLES) 100 MG capsule Take 2 capsules (200 mg total) by mouth 3 (three) times daily as needed for cough. 20 capsule Ivette Loyal, NP   promethazine-dextromethorphan (PROMETHAZINE-DM) 6.25-15 MG/5ML syrup Take 5 mLs by mouth 4 (four) times daily as needed for cough. 118 mL Ivette Loyal, NP   albuterol (PROVENTIL) (5 MG/ML) 0.5% nebulizer solution Take 0.5 mLs (2.5 mg total) by nebulization every 6 (six) hours as needed for wheezing or shortness of breath. 20 mL Ivette Loyal, NP     PDMP not reviewed this encounter.   Ivette Loyal, NP 10/04/20 1152

## 2020-10-05 ENCOUNTER — Telehealth: Payer: Self-pay | Admitting: Family Medicine

## 2020-10-05 NOTE — Telephone Encounter (Signed)
The patient want to the ED yesterday and they did not prescribe anything for her cough and she was wondering if Dr. Salomon Fick can call her in something.  [facility] Phone:  [phone]  Fax:  [phone]

## 2020-10-07 NOTE — Telephone Encounter (Signed)
Per chart review it looks like patient was sent in a prescription for Tessalon Perles, promethazine-DM cough syrup, and albuterol nebulizer solution to [facility] on 10/04/2020 for cough.

## 2020-10-13 ENCOUNTER — Other Ambulatory Visit: Payer: Self-pay

## 2020-10-14 ENCOUNTER — Ambulatory Visit (INDEPENDENT_AMBULATORY_CARE_PROVIDER_SITE_OTHER): Payer: No Typology Code available for payment source | Admitting: Family Medicine

## 2020-10-14 ENCOUNTER — Encounter: Payer: Self-pay | Admitting: Family Medicine

## 2020-10-14 VITALS — BP 124/80 | HR 69 | Temp 98.3°F | Ht 63.5 in | Wt 174.0 lb

## 2020-10-14 DIAGNOSIS — R635 Abnormal weight gain: Secondary | ICD-10-CM | POA: Diagnosis not present

## 2020-10-14 DIAGNOSIS — N912 Amenorrhea, unspecified: Secondary | ICD-10-CM

## 2020-10-14 DIAGNOSIS — E049 Nontoxic goiter, unspecified: Secondary | ICD-10-CM

## 2020-10-14 DIAGNOSIS — Z0000 Encounter for general adult medical examination without abnormal findings: Secondary | ICD-10-CM

## 2020-10-14 DIAGNOSIS — Z1211 Encounter for screening for malignant neoplasm of colon: Secondary | ICD-10-CM

## 2020-10-14 DIAGNOSIS — Z1159 Encounter for screening for other viral diseases: Secondary | ICD-10-CM

## 2020-10-14 LAB — CBC WITH DIFFERENTIAL/PLATELET
Basophils Absolute: 0 10*3/uL (ref 0.0–0.1)
Basophils Relative: 0.5 % (ref 0.0–3.0)
Eosinophils Absolute: 0.1 10*3/uL (ref 0.0–0.7)
Eosinophils Relative: 1.6 % (ref 0.0–5.0)
HCT: 45.2 % (ref 36.0–46.0)
Hemoglobin: 15.7 g/dL — ABNORMAL HIGH (ref 12.0–15.0)
Lymphocytes Relative: 41.5 % (ref 12.0–46.0)
Lymphs Abs: 3.2 10*3/uL (ref 0.7–4.0)
MCHC: 34.6 g/dL (ref 30.0–36.0)
MCV: 89.4 fl (ref 78.0–100.0)
Monocytes Absolute: 0.8 10*3/uL (ref 0.1–1.0)
Monocytes Relative: 11 % (ref 3.0–12.0)
Neutro Abs: 3.5 10*3/uL (ref 1.4–7.7)
Neutrophils Relative %: 45.4 % (ref 43.0–77.0)
Platelets: 306 10*3/uL (ref 150.0–400.0)
RBC: 5.06 Mil/uL (ref 3.87–5.11)
RDW: 13.9 % (ref 11.5–15.5)
WBC: 7.6 10*3/uL (ref 4.0–10.5)

## 2020-10-14 LAB — VITAMIN D 25 HYDROXY (VIT D DEFICIENCY, FRACTURES): VITD: 20.35 ng/mL — ABNORMAL LOW (ref 30.00–100.00)

## 2020-10-14 LAB — BASIC METABOLIC PANEL
BUN: 11 mg/dL (ref 6–23)
CO2: 28 mEq/L (ref 19–32)
Calcium: 9.3 mg/dL (ref 8.4–10.5)
Chloride: 103 mEq/L (ref 96–112)
Creatinine, Ser: 0.84 mg/dL (ref 0.40–1.20)
GFR: 81.14 mL/min (ref >60.00)
Glucose, Bld: 99 mg/dL (ref 70–99)
Potassium: 4.2 mEq/L (ref 3.5–5.1)
Sodium: 139 mEq/L (ref 135–145)

## 2020-10-14 LAB — LIPID PANEL
Cholesterol: 129 mg/dL (ref 0–200)
HDL: 31.7 mg/dL — ABNORMAL LOW (ref >39.00)
LDL Cholesterol: 57 mg/dL (ref 0–99)
NonHDL: 97.43
Total CHOL/HDL Ratio: 4
Triglycerides: 200 mg/dL — ABNORMAL HIGH (ref 0.0–149.0)
VLDL: 40 mg/dL (ref 0.0–40.0)

## 2020-10-14 LAB — POCT URINE PREGNANCY: Preg Test, Ur: NEGATIVE

## 2020-10-14 LAB — VITAMIN B12: Vitamin B-12: 236 pg/mL (ref 211–911)

## 2020-10-14 LAB — TSH: TSH: 2.82 u[IU]/mL (ref 0.35–4.50)

## 2020-10-14 LAB — T4, FREE: Free T4: 0.61 ng/dL (ref 0.60–1.60)

## 2020-10-14 LAB — HEMOGLOBIN A1C: Hgb A1c MFr Bld: 6 % (ref 4.6–6.5)

## 2020-10-14 NOTE — Progress Notes (Signed)
Subjective:     Lisa Chase is a 50 y.o. female and is here for a comprehensive physical exam. The patient reports weight gain and absent menses.  Pt unsure when her mom went through menopause.  Seeing Rheumatology for RA.  Notes wt gain since working from home more.  Needs to schedule pap, colonoscopy, and mammogram.  Social History   Socioeconomic History  . Marital status: Married    Spouse name: Not on file  . Number of children: Not on file  . Years of education: Not on file  . Highest education level: Not on file  Occupational History  . Not on file  Tobacco Use  . Smoking status: Never Smoker  . Smokeless tobacco: Never Used  Substance and Sexual Activity  . Alcohol use: No  . Drug use: No  . Sexual activity: Yes  Other Topics Concern  . Not on file  Social History Narrative  . Not on file   Social Determinants of Health   Financial Resource Strain: Not on file  Food Insecurity: Not on file  Transportation Needs: Not on file  Physical Activity: Not on file  Stress: Not on file  Social Connections: Not on file  Intimate Partner Violence: Not on file   Health Maintenance  Topic Date Due  . HIV Screening  Never done  . Hepatitis C Screening  Never done  . PAP SMEAR-Modifier  Never done  . COLONOSCOPY (Pts 45-28yrs Insurance coverage will need to be confirmed)  Never done  . INFLUENZA VACCINE  01/04/2021  . MAMMOGRAM  12/15/2021  . TETANUS/TDAP  07/06/2026  . COVID-19 Vaccine  Completed  . HPV VACCINES  Aged Out    The following portions of the patient's history were reviewed and updated as appropriate: allergies, current medications, past family history, past medical history, past social history, past surgical history and problem list.  Review of Systems Pertinent items noted in HPI and remainder of comprehensive ROS otherwise negative.   Objective:    BP 124/80 (BP Location: Left Arm, Patient Position: Sitting, Cuff Size: Normal)   Pulse 69    Temp 98.3 F (36.8 C) (Oral)   Ht 5' 3.5" (1.613 m)   Wt 174 lb (78.9 kg)   SpO2 97%   BMI 30.34 kg/m  General appearance: alert, cooperative and no distress Head: Normocephalic, without obvious abnormality, atraumatic Eyes: conjunctivae/corneas clear. PERRL, EOM's intact. Fundi benign. Ears: normal TM's and external ear canals both ears Nose: Nares normal. Septum midline. Mucosa normal. No drainage or sinus tenderness. Throat: lips, mucosa, and tongue normal; teeth and gums normal Neck: no adenopathy, no carotid bruit, no JVD, supple, symmetrical, trachea midline and thyroid mildly enlarged, symmetric, no tenderness/mass/nodules Lungs: clear to auscultation bilaterally Heart: regular rate and rhythm, S1, S2 normal, no murmur, click, rub or gallop Abdomen: soft, non-tender; bowel sounds normal; no masses,  no organomegaly Extremities: edema of b/l MCP joints, swan neck deformities of digits on b/l hands. Extremities otherwise normal, atraumatic, no cyanosis or edema Pulses: 2+ and symmetric Skin: Skin color, texture, turgor normal. No rashes or lesions Lymph nodes: Cervical, supraclavicular, and axillary nodes normal. Neurologic: Alert and oriented X 3, normal strength and tone. Normal symmetric reflexes. Normal coordination and gait    Assessment:    Healthy female exam.     Plan:     Anticipatory guidance given including wearing seatbelts, smoke detectors in the home, increasing physical activity, increasing p.o. intake of water and vegetables. -will obtain labs -offered  pap this visit.  Pt declines at this time. -pt to schedule mammogram -discussed colonoscopy-referral placed. -PHQ-9 score 7 -GAD-7 score 3 -given handout -next CPE in 1 yr See After Visit Summary for Counseling Recommendations    Encounter for hepatitis C screening test for low risk patient  - Plan: Hep C Antibody  Weight gain  -lifestyle modifications encouraged - Plan: TSH, T4, Free, Hemoglobin  A1c, Vitamin D, 25-hydroxy, Vitamin B12  Absent menses  -likely 2/2 perimenopause -will obtain labs -given handout - Plan: CBC with Differential/Platelet, TSH, T4, Free, POCT urine pregnancy  Colon cancer screening  - Plan: Ambulatory referral to Gastroenterology  Goiter  -stable - Plan: TSH, T4, Free  F/u prn   Abbe Amsterdam, MD

## 2020-10-14 NOTE — Patient Instructions (Addendum)
w Preventive Care 30-50 Years Old, Female Preventive care refers to lifestyle choices and visits with your health care provider that can promote health and wellness. This includes:  A yearly physical exam. This is also called an annual wellness visit.  Regular dental and eye exams.  Immunizations.  Screening for certain conditions.  Healthy lifestyle choices, such as: ? Eating a healthy diet. ? Getting regular exercise. ? Not using drugs or products that contain nicotine and tobacco. ? Limiting alcohol use. What can I expect for my preventive care visit? Physical exam Your health care provider will check your:  Height and weight. These may be used to calculate your BMI (body mass index). BMI is a measurement that tells if you are at a healthy weight.  Heart rate and blood pressure.  Body temperature.  Skin for abnormal spots. Counseling Your health care provider may ask you questions about your:  Past medical problems.  Family's medical history.  Alcohol, tobacco, and drug use.  Emotional well-being.  Home life and relationship well-being.  Sexual activity.  Diet, exercise, and sleep habits.  Work and work Statistician.  Access to firearms.  Method of birth control.  Menstrual cycle.  Pregnancy history. What immunizations do I need? Vaccines are usually given at various ages, according to a schedule. Your health care provider will recommend vaccines for you based on your age, medical history, and lifestyle or other factors, such as travel or where you work.   What tests do I need? Blood tests  Lipid and cholesterol levels. These may be checked every 5 years, or more often if you are over 52 years old.  Hepatitis C test.  Hepatitis B test. Screening  Lung cancer screening. You may have this screening every year starting at age 32 if you have a 30-pack-year history of smoking and currently smoke or have quit within the past 15 years.  Colorectal cancer  screening. ? All adults should have this screening starting at age 4 and continuing until age 34. ? Your health care provider may recommend screening at age 57 if you are at increased risk. ? You will have tests every 1-10 years, depending on your results and the type of screening test.  Diabetes screening. ? This is done by checking your blood sugar (glucose) after you have not eaten for a while (fasting). ? You may have this done every 1-3 years.  Mammogram. ? This may be done every 1-2 years. ? Talk with your health care provider about when you should start having regular mammograms. This may depend on whether you have a family history of breast cancer.  BRCA-related cancer screening. This may be done if you have a family history of breast, ovarian, tubal, or peritoneal cancers.  Pelvic exam and Pap test. ? This may be done every 3 years starting at age 44. ? Starting at age 63, this may be done every 5 years if you have a Pap test in combination with an HPV test. Other tests  STD (sexually transmitted disease) testing, if you are at risk.  Bone density scan. This is done to screen for osteoporosis. You may have this scan if you are at high risk for osteoporosis. Talk with your health care provider about your test results, treatment options, and if necessary, the need for more tests. Follow these instructions at home: Eating and drinking  Eat a diet that includes fresh fruits and vegetables, whole grains, lean protein, and low-fat dairy products.  Take vitamin and mineral  supplements as recommended by your health care provider.  Do not drink alcohol if: ? Your health care provider tells you not to drink. ? You are pregnant, may be pregnant, or are planning to become pregnant.  If you drink alcohol: ? Limit how much you have to 0-1 drink a day. ? Be aware of how much alcohol is in your drink. In the [place] one drink equals one 12 oz bottle of beer (355 mL), one 5 oz glass of  wine (148 mL), or one 1 oz glass of hard liquor (44 mL).   Lifestyle  Take daily care of your teeth and gums. Brush your teeth every morning and night with fluoride toothpaste. Floss one time each day.  Stay active. Exercise for at least 30 minutes 5 or more days each week.  Do not use any products that contain nicotine or tobacco, such as cigarettes, e-cigarettes, and chewing tobacco. If you need help quitting, ask your health care provider.  Do not use drugs.  If you are sexually active, practice safe sex. Use a condom or other form of protection to prevent STIs (sexually transmitted infections).  If you do not wish to become pregnant, use a form of birth control. If you plan to become pregnant, see your health care provider for a prepregnancy visit.  If told by your health care provider, take low-dose aspirin daily starting at age 65.  Find healthy ways to cope with stress, such as: ? Meditation, yoga, or listening to music. ? Journaling. ? Talking to a trusted person. ? Spending time with friends and family. Safety  Always wear your seat belt while driving or riding in a vehicle.  Do not drive: ? If you have been drinking alcohol. Do not ride with someone who has been drinking. ? When you are tired or distracted. ? While texting.  Wear a helmet and other protective equipment during sports activities.  If you have firearms in your house, make sure you follow all gun safety procedures. What's next?  Visit your health care provider once a year for an annual wellness visit.  Ask your health care provider how often you should have your eyes and teeth checked.  Stay up to date on all vaccines. This information is not intended to replace advice given to you by your health care provider. Make sure you discuss any questions you have with your health care provider. Document Revised: 02/25/2020 Document Reviewed: 02/01/2018 Elsevier Patient Education  2021 [place]  A goiter is an enlarged thyroid gland. The thyroid is located in the lower front of the neck. It makes hormones that affect many body parts and systems, including the system that affects how quickly the body burns fuel for energy (metabolism). Most goiters are painless and are not a cause for concern. Some goiters can affect the way your thyroid makes thyroid hormones. Goiters and conditions that cause goiters can be treated, if necessary. What are the causes? Common causes of this condition include:  Lack (deficiency) of a mineral called iodine. The thyroid gland uses iodine to make thyroid hormones.  Diseases that attack healthy cells in the body (autoimmune diseases) and affect thyroid function, such as Graves' disease or Hashimoto's disease. These diseases may cause the body to produce too much thyroid hormone (hyperthyroidism) or too little of the hormone (hypothyroidism).  Conditions that cause inflammation of the thyroid (thyroiditis).  One or more small growths on the thyroid (nodular goiter). Other causes include:  Medical problems  caused by abnormal genes that are passed from parent to child (genetic defects).  Thyroid injury or infection.  Tumors that may or may not be cancerous.  Pregnancy.  Certain medicines.  Exposure to radiation. In some cases, the cause may not be known. What increases the risk? This condition is more likely to develop in:  People who do not get enough iodine in their diet.  People who have a family history of goiter.  Women.  People who are older than age 84.  People who smoke tobacco.  People who have had exposure to radiation. What are the signs or symptoms? The main symptom of this condition is swelling in the lower, front part of the neck. This swelling can range from a very small bump to a large lump. Other symptoms may include:  A tight feeling in the throat.  A hoarse  voice.  Coughing.  Wheezing.  Difficulty swallowing or breathing.  Bulging veins in the neck.  Dizziness. When a goiter is the result of an overactive thyroid (hyperthyroidism), symptoms may also include:  Nervousness or restlessness.  Inability to tolerate heat.  Unexplained weight loss.  Diarrhea.  Change in the texture of hair or skin.  Changes in heartbeat, such as skipped beats, extra beats, or a rapid heart rate.  Loss of menstruation.  Shaky hands.  Increased appetite.  Sleep problems. When a goiter is the result of an underactive thyroid (hypothyroidism), symptoms may also include:  Feeling like you have no energy (lethargy).  Inability to tolerate cold.  Weight gain that is not explained by a change in diet or exercise habits.  Dry skin.  Coarse hair.  Irregular menstrual periods.  Constipation.  Sadness or depression.  Fatigue. In some cases, there may not be any symptoms and the thyroid hormone levels may be normal.   How is this diagnosed? This condition may be diagnosed based on your symptoms, your medical history, and a physical exam. You may have tests, such as:  Blood tests to check thyroid function.  Imaging tests, such as: ? Ultrasound. ? CT scan. ? MRI. ? Thyroid scan.  Removal of a tissue sample (biopsy) of the goiter or any nodules. The sample will be tested to check for cancer. How is this treated? Treatment for this condition depends on the cause and your symptoms. Treatment may include:  Medicines to regulate thyroid hormone levels.  Anti-inflammatory medicines or steroid medicines, if the goiter is caused by inflammation.  Iodine supplements or changes to your diet, if the goiter is caused by iodine deficiency.  Radioactive iodine treatment.  Surgery to remove your thyroid. In some cases, you may only need regular check-ups with your health care provider to monitor your condition, and you may not need  treatment. Follow these instructions at home:  Follow instructions from your health care provider about any changes to your diet.  Take over-the-counter and prescription medicines only as told by your health care provider. These include supplements.  Do not use any products that contain nicotine or tobacco, such as cigarettes and e-cigarettes. If you need help quitting, ask your health care provider.  Keep all follow-up visits as told by your health care provider. This is important. Contact a health care provider if:  Your symptoms do not get better with treatment.  You have nausea, vomiting, or diarrhea. Get help right away if:  You have sudden, unexplained confusion or other mental changes.  You have a fever.  You have chest pain.  You  have trouble breathing or swallowing.  You suddenly become very weak.  You experience extreme restlessness.  You feel your heart racing. Summary  A goiter is an enlarged thyroid gland.  The thyroid gland is located in the lower front of the neck. It makes hormones that affect many body parts and systems, including the system that affects how quickly the body burns fuel for energy (metabolism).  The main symptom of this condition is swelling in the lower, front part of the neck. This swelling can range from a very small bump to a large lump.  Treatment for this condition depends on the cause and your symptoms. You may need medicines, supplements, or regular monitoring of your condition. This information is not intended to replace advice given to you by your health care provider. Make sure you discuss any questions you have with your health care provider. Document Revised: 01/30/2020 Document Reviewed: 01/30/2020 Elsevier Patient Education  [place].  https://www.uspreventiveservicestaskforce.org/uspstf/recommendation/lung-cancer-screening">  Cancer Screening for Women A cancer screening is a test or exam that checks for cancer.  Your health care provider will recommend specific cancer screenings based on your age, medical history (including risk factors), and family history of cancer. Work with your health care provider to create a cancer screening schedule that protects your health. Who should have screening? All women should be considered for screening of certain cancers, including breast cancer, cervical cancer, colorectal cancer, and skin cancer. Your health care provider may recommend screenings for other types of cancer if:  You had cancer before.  You have a family member with cancer.  You have abnormal genes that could increase the risk of cancer.  You have risk factors for certain cancers, such as current or past use of tobacco products, or being overweight. When you should be screened for cancer depends on:  Your age.  Your medical history and your family's medical history.  Certain lifestyle factors, such as smoking or other use of tobacco products.  Environmental exposure, such as to asbestos. How is screening done? Breast cancer Breast cancer screening is done with a test that takes images of breast tissue (mammogram). Here are some screening guidelines for women at average risk:  When you are age 4-44, you will be given the choice to start having mammograms.  When you are age 65-54, you should have a mammogram every year.  You may start having mammograms before age 51 if you have risk factors for breast cancer, such as having an immediate family member with breast cancer.  At age 55 or older, you should have a mammogram every 1-2 years for as long as you are in good health and have a life expectancy of 10 years or longer.  It is important to know what your breasts look and feel like so you can report any changes to your health care provider.   Cervical cancer  All women at average risk should be considered for screening for cervical cancer starting no later than age 76 and continuing until  age 61. Screening should not begin earlier than age 32. You will have tests every 3-5 years, depending on your results and the type of screening test. Talk with your health care provider about which screening test is right for you and how often you should be screened. ? Cervical cancer screening is done with an HPV (human papillomavirus) test to identify the virus that causes cervical cancer. To perform the test, a health care provider takes a swab of cells from yourcervix  during a pelvic exam. This test may be performed along with a Pap test. This testchecks for abnormalities in the lowest part of the uterus (cervix). ? If you have had the HPV vaccine, you will still be screened for cervical cancer and follow normal screening recommendations. You do not need to be screened for cervical cancer if any of the following apply to you:  You are older than age 10 and you have had normal screening tests in the past 10 years with no serious cervical precancer or cancer in the last 25 years.  Your cervix and uterus have been removed, and you have never had cervical cancer or abnormal cells that could become cancer (precancerous cells). Colorectal cancer All adults at average risk should be considered for screening for colorectal cancer starting at age 67 and continuing until age 50. Your health care provider may recommend screening at age 25 if you are at higher risk due to family history of colon or rectal cancer or other risk factors. You will have tests every 1-10 years, depending on your results and the type of screening test. Talk with your health care provider about which screening test is right for you and how often you should be screened. Colorectal cancer screening looks for cancer or for growths called polyps that often form before cancer starts. Tests to look for cancer or polyps include:  Colonoscopy or flexible sigmoidoscopy. For these procedures, a flexible tube with a small camera is inserted into  the rectum.  CT colonography. This test uses X-rays and a contrast dye to check the colon for polyps. If a polyp is found, you may need to have a colonoscopy so the polyp can be located and removed. Tests to look for cancer in the stool (feces) include:  Guaiac-based fecal occult blood test (FOBT). This test can find blood in stool. It can be done at home with a kit.  Fecal immunochemical test (FIT). This test can find blood in stool. For this test, you will need to collect stool samples at home.  Stool DNA test. This test looks for blood in stool and any changes in DNA that can lead to colon cancer. For this test, you will need to collect a stool sample at home and send it to a lab. Endometrial cancer There is no standard screening test for endometrial cancer, and women at average risk do not routinely need to have this screening. Talk with your health care provider about whether screening is right for you. If it is, this screening is performed through:  Endometrial tissue biopsy. This tests a sample of tissue taken from the lining of the uterus.  Vaginal ultrasound. If you are at increased risk for endometrial cancer, you may need to have these tests more often than normal. You are at increased risk if:  You have a family history of ovarian, uterine, or colon cancer.  You are taking tamoxifen, a medicine used to treat breast cancer.  You have certain types of colon cancer. If you have reached menopause, it is especially important to talk with your health care provider about any vaginal bleeding or spotting. Screening for endometrial cancer is not recommended for women who do not have symptoms of the cancer, such as vaginal bleeding. Lung cancer Lung cancer screening is done with a CT scan that looks for abnormal changes in the lungs. Discuss lung cancer screening with your health care provider if you are 42-50 years old and if any of the following apply to  you:  You currently  smoke.  You used to smoke heavily.  You have a smoking history of 1 pack of cigarettes a day for 20 years or 2 packs a day for 10 years.  You have quit smoking within the past 15 years. You may need to be screened every year if you smoke heavily or if you used to smoke. Skin cancer Skin cancer screening is done by checking the skin for unusual moles or spots and any changes in existing moles. Your health care provider should check your skin for signs of skin cancer at every physical exam. You should check your skin every month and tell your health care provider right away if anything looks unusual. Women with a higher-than-normal risk for skin cancer may want to see a skin specialist (dermatologist) for an annual body check. What are the benefits of screening? Cancer screening is done to look for cancer in the very early stages, before it spreads and becomes harder to treat and before you would start to notice symptoms. Finding cancer early improves the chances of successful treatment. It may save your life. Where to find more information  American Cancer Society: www.cancer.org  Centers for Disease Control and Prevention: http://www.wolf.info/  National Cancer Institute: www.cancer.gov  [place] Department of Health and Human Services: [place] Contact a health care provider if: You have concerns about any signs or symptoms of cancer. These may include:  Skin problems. You may have: ? Moles of an unusual shape or color. ? Changes in existing moles. ? A sore on your skin that does not heal.  Tiredness (fatigue) that does not go away.  Losing weight without trying.  Blood in your urine or stool.  Problems with coughing or breathing. These may include: ? Coughing or trouble breathing that does not go away. ? Coughing up blood.  Lumps or other changes in your breasts.  Vaginal bleeding, spotting, or changes in your periods.  Frequent pain or cramping in your  abdomen. Summary  Your health care provider will recommend specific cancer screenings based on your age, medical history, and family history of cancer.  Work with your health care provider to create a cancer screening schedule that protects your health.  Finding cancer early improves the chances of successful treatment. It may save your life.  Contact a health care provider if you have concerns about any signs or symptoms of cancer. This information is not intended to replace advice given to you by your health care provider. Make sure you discuss any questions you have with your health care provider. Document Revised: 10/14/2019 Document Reviewed: 04/19/2019 Elsevier Patient Education  2021 [place] [phone] ed., pp. [phone]). [place], [place]: Elsevier.">  Perimenopause Perimenopause is the normal time of a woman's life when the levels of estrogen, the female hormone produced by the ovaries, begin to decrease. This leads to changes in menstrual periods before they stop completely (menopause). Perimenopause can begin 2-8 years before menopause. During perimenopause, the ovaries may or may not produce an egg and a woman can still become pregnant. What are the causes? This condition is caused by a natural change in hormone levels that happens as you get older. What increases the risk? This condition is more likely to start at an earlier age if you have certain medical conditions or have undergone treatments, including:  A tumor of the pituitary gland in the brain.  A disease that affects the ovaries and hormone production.  Certain cancer treatments,  such as chemotherapy or hormone therapy, or radiation therapy on the pelvis.  Heavy smoking and excessive alcohol use.  Family history of early menopause. What are the signs or symptoms? Perimenopausal changes affect each woman differently. Symptoms of this condition may include:  Hot  flashes.  Irregular menstrual periods.  Night sweats.  Changes in feelings about sex. This could be a decrease in sex drive or an increased discomfort around your sexuality.  Vaginal dryness.  Headaches.  Mood swings.  Depression.  Problems sleeping (insomnia).  Memory problems or trouble concentrating.  Irritability.  Tiredness.  Weight gain.  Anxiety.  Trouble getting pregnant. How is this diagnosed? This condition is diagnosed based on your medical history, a physical exam, your age, your menstrual history, and your symptoms. Hormone tests may also be done. How is this treated? In some cases, no treatment is needed. You and your health care provider should make a decision together about whether treatment is necessary. Treatment will be based on your individual condition and preferences. Various treatments are available, such as:  Menopausal hormone therapy (MHT).  Medicines to treat specific symptoms.  Acupuncture.  Vitamin or herbal supplements. Before starting treatment, make sure to let your health care provider know if you have a personal or family history of:  Heart disease.  Breast cancer.  Blood clots.  Diabetes.  Osteoporosis. Follow these instructions at home: Medicines  Take over-the-counter and prescription medicines only as told by your health care provider.  Take vitamin supplements only as told by your health care provider.  Talk with your health care provider before starting any herbal supplements. Lifestyle  Do not use any products that contain nicotine or tobacco, such as cigarettes, e-cigarettes, and chewing tobacco. If you need help quitting, ask your health care provider.  Get at least 30 minutes of physical activity on 5 or more days each week.  Eat a balanced diet that includes fresh fruits and vegetables, whole grains, soybeans, eggs, lean meat, and low-fat dairy.  Avoid alcoholic and caffeinated beverages, as well as spicy  foods. This may help prevent hot flashes.  Get 7-8 hours of sleep each night.  Dress in layers that can be removed to help you manage hot flashes.  Find ways to manage stress, such as deep breathing, meditation, or journaling.   General instructions  Keep track of your menstrual periods, including: ? When they occur. ? How heavy they are and how long they last. ? How much time passes between periods.  Keep track of your symptoms, noting when they start, how often you have them, and how long they last.  Use vaginal lubricants or moisturizers to help with vaginal dryness and improve comfort during sex.  You can still become pregnant if you are having irregular periods. Make sure you use contraception during perimenopause if you do not want to get pregnant.  Keep all follow-up visits. This is important. This includes any group therapy or counseling.   Contact a health care provider if:  You have heavy vaginal bleeding or pass blood clots.  Your period lasts more than 2 days longer than normal.  Your periods are recurring sooner than 21 days.  You bleed after having sex.  You have pain during sex. Get help right away if you have:  Chest pain, trouble breathing, or trouble talking.  Severe depression.  Pain when you urinate.  Severe headaches.  Vision problems. Summary  Perimenopause is the time when a woman's body begins to move into menopause.  This may happen naturally or as a result of other health problems or medical treatments.  Perimenopause can begin 2-8 years before menopause, and it can last for several years.  Perimenopausal symptoms can be managed through medicines, lifestyle changes, and complementary therapies such as acupuncture. This information is not intended to replace advice given to you by your health care provider. Make sure you discuss any questions you have with your health care provider. Document Revised: 11/07/2019 Document Reviewed:  11/07/2019 Elsevier Patient Education  [place].

## 2020-10-14 NOTE — Telephone Encounter (Signed)
Patient has appointment with pcp today.

## 2020-10-15 ENCOUNTER — Other Ambulatory Visit (HOSPITAL_COMMUNITY): Payer: Self-pay

## 2020-10-15 ENCOUNTER — Other Ambulatory Visit: Payer: Self-pay | Admitting: Family Medicine

## 2020-10-15 DIAGNOSIS — E559 Vitamin D deficiency, unspecified: Secondary | ICD-10-CM

## 2020-10-15 LAB — HEPATITIS C ANTIBODY
Hepatitis C Ab: NONREACTIVE
SIGNAL TO CUT-OFF: 0.01 (ref <1.00)

## 2020-10-15 MED ORDER — VITAMIN D (ERGOCALCIFEROL) 1.25 MG (50000 UNIT) PO CAPS
50000.0000 [IU] | ORAL_CAPSULE | ORAL | 0 refills | Status: DC
  Filled 2020-10-15: qty 12, 84d supply, fill #0

## 2020-10-22 ENCOUNTER — Other Ambulatory Visit (HOSPITAL_COMMUNITY): Payer: Self-pay

## 2020-10-22 ENCOUNTER — Other Ambulatory Visit: Payer: Self-pay

## 2020-10-22 DIAGNOSIS — E559 Vitamin D deficiency, unspecified: Secondary | ICD-10-CM

## 2020-10-22 MED ORDER — VITAMIN D (ERGOCALCIFEROL) 1.25 MG (50000 UNIT) PO CAPS
50000.0000 [IU] | ORAL_CAPSULE | ORAL | 0 refills | Status: DC
  Filled 2020-10-22 – 2020-11-03 (×2): qty 12, 84d supply, fill #0

## 2020-10-23 ENCOUNTER — Ambulatory Visit (INDEPENDENT_AMBULATORY_CARE_PROVIDER_SITE_OTHER): Payer: No Typology Code available for payment source

## 2020-10-23 ENCOUNTER — Other Ambulatory Visit: Payer: Self-pay

## 2020-10-23 DIAGNOSIS — E538 Deficiency of other specified B group vitamins: Secondary | ICD-10-CM | POA: Diagnosis not present

## 2020-10-23 MED ORDER — CYANOCOBALAMIN 1000 MCG/ML IJ SOLN
1000.0000 ug | Freq: Once | INTRAMUSCULAR | Status: AC
  Administered 2020-10-23: 1000 ug via INTRAMUSCULAR

## 2020-10-23 NOTE — Progress Notes (Signed)
Patient was given B12 injection by Steffanie Rainwater, per orders from Dr Salomon Fick, patient tolerated well.

## 2020-10-26 ENCOUNTER — Other Ambulatory Visit (HOSPITAL_COMMUNITY): Payer: Self-pay

## 2020-10-27 ENCOUNTER — Other Ambulatory Visit (HOSPITAL_COMMUNITY): Payer: Self-pay

## 2020-10-27 ENCOUNTER — Other Ambulatory Visit: Payer: Self-pay | Admitting: Pharmacist

## 2020-10-27 MED ORDER — ENBREL SURECLICK 50 MG/ML ~~LOC~~ SOAJ
SUBCUTANEOUS | 1 refills | Status: DC
  Filled 2020-10-27: qty 4, 28d supply, fill #0
  Filled 2020-11-26: qty 4, 28d supply, fill #1

## 2020-10-27 MED ORDER — ENBREL SURECLICK 50 MG/ML ~~LOC~~ SOAJ: SUBCUTANEOUS | 1 refills | Status: DC

## 2020-11-03 ENCOUNTER — Other Ambulatory Visit (HOSPITAL_COMMUNITY): Payer: Self-pay

## 2020-11-11 ENCOUNTER — Other Ambulatory Visit (HOSPITAL_COMMUNITY): Payer: Self-pay

## 2020-11-23 ENCOUNTER — Other Ambulatory Visit: Payer: Self-pay

## 2020-11-23 ENCOUNTER — Ambulatory Visit: Payer: No Typology Code available for payment source

## 2020-11-23 ENCOUNTER — Ambulatory Visit (INDEPENDENT_AMBULATORY_CARE_PROVIDER_SITE_OTHER): Payer: No Typology Code available for payment source

## 2020-11-23 DIAGNOSIS — E538 Deficiency of other specified B group vitamins: Secondary | ICD-10-CM | POA: Diagnosis not present

## 2020-11-23 MED ORDER — CYANOCOBALAMIN 1000 MCG/ML IJ SOLN
1000.0000 ug | Freq: Once | INTRAMUSCULAR | Status: AC
  Administered 2020-11-23: 1000 ug via INTRAMUSCULAR

## 2020-11-23 NOTE — Progress Notes (Signed)
Per orders from Dr. Salomon Fick, patient given a B12 injection by Steffanie Rainwater, tolerated well.

## 2020-11-26 ENCOUNTER — Other Ambulatory Visit (HOSPITAL_COMMUNITY): Payer: Self-pay

## 2020-12-01 ENCOUNTER — Other Ambulatory Visit (HOSPITAL_COMMUNITY): Payer: Self-pay

## 2020-12-17 ENCOUNTER — Telehealth: Payer: Self-pay | Admitting: Pharmacist

## 2020-12-17 NOTE — Telephone Encounter (Signed)
Called patient to schedule an appointment for the [facility]. I was unable to reach the patient so I left a HIPAA-compliant message requesting that the patient return my call.   Luke Van Ausdall, PharmD, BCACP, CPP Clinical Pharmacist [facility] [phone]  

## 2020-12-18 ENCOUNTER — Ambulatory Visit
Admission: RE | Admit: 2020-12-18 | Discharge: 2020-12-18 | Disposition: A | Discharge status: Home / Self Care | Payer: No Typology Code available for payment source | Source: Ambulatory Visit | Attending: Family Medicine | Admitting: Family Medicine

## 2020-12-18 ENCOUNTER — Other Ambulatory Visit: Payer: Self-pay

## 2020-12-18 DIAGNOSIS — Z1231 Encounter for screening mammogram for malignant neoplasm of breast: Secondary | ICD-10-CM

## 2020-12-23 ENCOUNTER — Ambulatory Visit: Payer: No Typology Code available for payment source | Attending: Family Medicine | Admitting: Pharmacist

## 2020-12-23 ENCOUNTER — Other Ambulatory Visit (HOSPITAL_COMMUNITY): Payer: Self-pay

## 2020-12-23 ENCOUNTER — Other Ambulatory Visit: Payer: Self-pay | Admitting: Pharmacist

## 2020-12-23 ENCOUNTER — Ambulatory Visit (INDEPENDENT_AMBULATORY_CARE_PROVIDER_SITE_OTHER): Payer: No Typology Code available for payment source

## 2020-12-23 ENCOUNTER — Other Ambulatory Visit: Payer: Self-pay

## 2020-12-23 DIAGNOSIS — Z79899 Other long term (current) drug therapy: Secondary | ICD-10-CM

## 2020-12-23 DIAGNOSIS — E538 Deficiency of other specified B group vitamins: Secondary | ICD-10-CM | POA: Diagnosis not present

## 2020-12-23 MED ORDER — ENBREL SURECLICK 50 MG/ML ~~LOC~~ SOAJ
SUBCUTANEOUS | 1 refills | Status: DC
  Filled 2020-12-23: qty 4, 28d supply, fill #0
  Filled 2021-01-22: qty 4, 28d supply, fill #1

## 2020-12-23 MED ORDER — ENBREL SURECLICK 50 MG/ML ~~LOC~~ SOAJ: SUBCUTANEOUS | 1 refills | Status: DC

## 2020-12-23 MED ORDER — CYANOCOBALAMIN 1000 MCG/ML IJ SOLN
1000.0000 ug | Freq: Once | INTRAMUSCULAR | Status: AC
  Administered 2020-12-23: 1000 ug via INTRAMUSCULAR

## 2020-12-23 NOTE — Progress Notes (Signed)
Per orders from Dr Banks, pt was given B12 injection by Layloni Fahrner, pt tolerated well. 

## 2020-12-23 NOTE — Progress Notes (Signed)
  S: Patient presents for review of their specialty medication therapy.  Patient is currently taking Enbrel for RA. Patient is managed by Dr. Janyth Contes for this.  Dosing: RA - SubQ: Note: May continue methotrexate, glucocorticoids, salicylates, NSAIDs, or analgesics during etanercept therapy. Once-weekly dosing: 50 mg once weekly  Monitoring: Injection site reactions: reports occasional swelling/redness; resolves quickly and rheumatologist is aware S/sx of infections: denies  S/sx of malignancy: denies GI upset: denies   O: Lab Results  Component Value Date   WBC 7.6 10/14/2020   HGB 15.7 (H) 10/14/2020   HCT 45.2 10/14/2020   MCV 89.4 10/14/2020   PLT 306.0 10/14/2020      Chemistry      Component Value Date/Time   NA 139 10/14/2020 0945   K 4.2 10/14/2020 0945   CL 103 10/14/2020 0945   CO2 28 10/14/2020 0945   BUN 11 10/14/2020 0945   CREATININE 0.84 10/14/2020 0945   CREATININE 0.92 08/22/2012 2053      Component Value Date/Time   CALCIUM 9.3 10/14/2020 0945   ALKPHOS 58 10/10/2019 1004   AST 37 10/10/2019 1004   ALT 41 (H) 10/10/2019 1004   BILITOT 0.6 10/10/2019 1004     A/P: 1. Medication review: patient currently on Enbrel for RA and is tolerating it. Reviewed the medication with the patient, including the following: Enbrel (etanercept) binds tumor necrosis factor (TNF) and blocks its interaction with cell surface receptors. TNF plays an important role in the inflammatory processes of many diseases. Patient educated on purpose, proper use and potential adverse effects of Enbrel. Adverse effects include rash, GI upset, increased risk of infection, and injection site reactions. Patients should stop Enbrel if they develop a serious infection. There is a possible increased risk in lymphoma and other malignancies. No recommendations for any changes.   Butch Penny, PharmD, Patsy [place], CPP Clinical Pharmacist [facility] [phone]

## 2020-12-29 ENCOUNTER — Other Ambulatory Visit (HOSPITAL_COMMUNITY): Payer: Self-pay

## 2021-01-22 ENCOUNTER — Other Ambulatory Visit (HOSPITAL_COMMUNITY): Payer: Self-pay

## 2021-01-26 ENCOUNTER — Other Ambulatory Visit (HOSPITAL_COMMUNITY): Payer: Self-pay

## 2021-02-18 ENCOUNTER — Other Ambulatory Visit: Payer: Self-pay | Admitting: Pharmacist

## 2021-02-18 ENCOUNTER — Other Ambulatory Visit (HOSPITAL_COMMUNITY): Payer: Self-pay

## 2021-02-18 MED ORDER — ENBREL SURECLICK 50 MG/ML ~~LOC~~ SOAJ
SUBCUTANEOUS | 1 refills | Status: DC
  Filled 2021-02-18 (×2): qty 4, 28d supply, fill #0
  Filled 2021-03-17: qty 4, 28d supply, fill #1

## 2021-02-18 MED ORDER — ENBREL SURECLICK 50 MG/ML ~~LOC~~ SOAJ
SUBCUTANEOUS | 1 refills | Status: DC
  Filled 2021-02-18: qty 4, fill #0

## 2021-02-18 MED ORDER — ENBREL SURECLICK 50 MG/ML ~~LOC~~ SOAJ: SUBCUTANEOUS | 1 refills | Status: DC

## 2021-02-23 ENCOUNTER — Other Ambulatory Visit (HOSPITAL_COMMUNITY): Payer: Self-pay

## 2021-03-17 ENCOUNTER — Other Ambulatory Visit (HOSPITAL_COMMUNITY): Payer: Self-pay

## 2021-03-18 ENCOUNTER — Other Ambulatory Visit (HOSPITAL_COMMUNITY): Payer: Self-pay

## 2021-03-30 ENCOUNTER — Other Ambulatory Visit (HOSPITAL_COMMUNITY): Payer: Self-pay

## 2021-04-20 ENCOUNTER — Other Ambulatory Visit: Payer: Self-pay | Admitting: Internal Medicine

## 2021-04-20 ENCOUNTER — Other Ambulatory Visit (HOSPITAL_COMMUNITY): Payer: Self-pay

## 2021-04-20 NOTE — Telephone Encounter (Signed)
Requested medication (s) are due for refill today - yes  Requested medication (s) are on the active medication list -yes  Future visit scheduled -no  Last refill: 02/18/21 36ml 1RF  Notes to clinic: Request RF: non delegated Rx, fails lab protocol  Requested Prescriptions  Pending Prescriptions Disp Refills   etanercept (ENBREL SURECLICK) 50 MG/ML injection 4 mL 1    Sig: INJECT 50 MG SUBCUTANEOUS ONCE WEEKLY.     Not Delegated - Immunology: Tumor Necrosis Factor Blockers Failed - 04/20/2021  1:15 PM      Failed - This refill cannot be delegated      Failed - Patient should have a PPD Skin Test annually.      Failed - AST in normal range and within 180 days    AST  Date Value Ref Range Status  10/10/2019 37 0 - 37 U/L Final          Failed - ALT in normal range and within 180 days    ALT  Date Value Ref Range Status  10/10/2019 41 (H) 0 - 35 U/L Final          Failed - HCT in normal range and within 180 days    HCT  Date Value Ref Range Status  10/14/2020 45.2 36.0 - 46.0 % Final          Failed - HGB in normal range and within 180 days    Hemoglobin  Date Value Ref Range Status  10/14/2020 15.7 (H) 12.0 - 15.0 g/dL Final          Failed - PLT in normal range and within 180 days    Platelets  Date Value Ref Range Status  10/14/2020 306.0 150.0 - 400.0 K/uL Final   Platelet Count, POC  Date Value Ref Range Status  08/22/2012 394 142 - 424 K/uL Final          Failed - WBC in normal range and within 180 days    WBC  Date Value Ref Range Status  10/14/2020 7.6 4.0 - 10.5 K/uL Final          Passed - Valid encounter within last 6 months    Recent Outpatient Visits           3 months ago Medication management   [facility] Drucilla Chalet, RPH-CPP   1 year ago Medication management   [facility] [place], Cornelius Moras, RPH-CPP   8 years ago Neck pain on left side   [facility], Thao P, DO                 Requested Prescriptions  Pending Prescriptions Disp Refills   etanercept (ENBREL SURECLICK) 50 MG/ML injection 4 mL 1    Sig: INJECT 50 MG SUBCUTANEOUS ONCE WEEKLY.     Not Delegated - Immunology: Tumor Necrosis Factor Blockers Failed - 04/20/2021  1:15 PM      Failed - This refill cannot be delegated      Failed - Patient should have a PPD Skin Test annually.      Failed - AST in normal range and within 180 days    AST  Date Value Ref Range Status  10/10/2019 37 0 - 37 U/L Final          Failed - ALT in normal range and within 180 days    ALT  Date Value Ref Range Status  10/10/2019 41 (H) 0 - 35 U/L Final          Failed - HCT in normal range and within 180 days    HCT  Date Value Ref Range Status  10/14/2020 45.2 36.0 - 46.0 % Final          Failed - HGB in normal range and within 180 days    Hemoglobin  Date Value Ref Range Status  10/14/2020 15.7 (H) 12.0 - 15.0 g/dL Final          Failed - PLT in normal range and within 180 days    Platelets  Date Value Ref Range Status  10/14/2020 306.0 150.0 - 400.0 K/uL Final   Platelet Count, POC  Date Value Ref Range Status  08/22/2012 394 142 - 424 K/uL Final          Failed - WBC in normal range and within 180 days    WBC  Date Value Ref Range Status  10/14/2020 7.6 4.0 - 10.5 K/uL Final          Passed - Valid encounter within last 6 months    Recent Outpatient Visits           3 months ago Medication management   [facility] Drucilla Chalet, RPH-CPP   1 year ago Medication management    [facility] [place], Cornelius Moras, RPH-CPP   8 years ago Neck pain on left side   [facility], Thao P, DO

## 2021-04-21 ENCOUNTER — Other Ambulatory Visit: Payer: Self-pay | Admitting: Pharmacist

## 2021-04-21 ENCOUNTER — Other Ambulatory Visit (HOSPITAL_COMMUNITY): Payer: Self-pay

## 2021-04-21 MED ORDER — ENBREL SURECLICK 50 MG/ML ~~LOC~~ SOAJ
SUBCUTANEOUS | 1 refills | Status: DC
  Filled 2021-04-21: qty 4, 28d supply, fill #0
  Filled 2021-05-14 – 2021-06-03 (×3): qty 4, 28d supply, fill #1

## 2021-04-21 MED ORDER — ENBREL SURECLICK 50 MG/ML ~~LOC~~ SOAJ: SUBCUTANEOUS | 1 refills | Status: DC

## 2021-04-23 ENCOUNTER — Other Ambulatory Visit (HOSPITAL_COMMUNITY): Payer: Self-pay

## 2021-04-26 ENCOUNTER — Other Ambulatory Visit (HOSPITAL_COMMUNITY): Payer: Self-pay

## 2021-05-11 ENCOUNTER — Other Ambulatory Visit (HOSPITAL_COMMUNITY): Payer: Self-pay

## 2021-05-12 ENCOUNTER — Other Ambulatory Visit (HOSPITAL_COMMUNITY): Payer: Self-pay

## 2021-05-14 ENCOUNTER — Other Ambulatory Visit (HOSPITAL_COMMUNITY): Payer: Self-pay

## 2021-05-25 ENCOUNTER — Other Ambulatory Visit (HOSPITAL_COMMUNITY): Payer: Self-pay

## 2021-05-27 ENCOUNTER — Other Ambulatory Visit (HOSPITAL_COMMUNITY): Payer: Self-pay

## 2021-06-01 ENCOUNTER — Other Ambulatory Visit (HOSPITAL_COMMUNITY): Payer: Self-pay

## 2021-06-03 ENCOUNTER — Other Ambulatory Visit (HOSPITAL_COMMUNITY): Payer: Self-pay

## 2021-06-22 ENCOUNTER — Other Ambulatory Visit (HOSPITAL_COMMUNITY): Payer: Self-pay

## 2021-06-23 ENCOUNTER — Other Ambulatory Visit (HOSPITAL_COMMUNITY): Payer: Self-pay

## 2021-06-24 ENCOUNTER — Other Ambulatory Visit: Payer: Self-pay | Admitting: Pharmacist

## 2021-06-24 ENCOUNTER — Other Ambulatory Visit (HOSPITAL_COMMUNITY): Payer: Self-pay

## 2021-06-24 MED ORDER — ENBREL SURECLICK 50 MG/ML ~~LOC~~ SOAJ
SUBCUTANEOUS | 2 refills | Status: DC
  Filled 2021-06-24: qty 4, 28d supply, fill #0
  Filled 2021-07-19: qty 4, 28d supply, fill #1
  Filled 2021-08-10: qty 4, 28d supply, fill #2

## 2021-06-24 MED ORDER — ENBREL SURECLICK 50 MG/ML ~~LOC~~ SOAJ: SUBCUTANEOUS | 2 refills | Status: DC

## 2021-07-12 ENCOUNTER — Telehealth: Payer: No Typology Code available for payment source | Admitting: Family Medicine

## 2021-07-12 ENCOUNTER — Telehealth: Payer: Self-pay | Admitting: Family Medicine

## 2021-07-12 ENCOUNTER — Telehealth (INDEPENDENT_AMBULATORY_CARE_PROVIDER_SITE_OTHER): Payer: No Typology Code available for payment source | Admitting: Family Medicine

## 2021-07-12 ENCOUNTER — Encounter: Payer: Self-pay | Admitting: Family Medicine

## 2021-07-12 VITALS — HR 86 | Temp 99.0°F | Ht 63.5 in | Wt 174.0 lb

## 2021-07-12 DIAGNOSIS — Z7969 Long term (current) use of other immunomodulators and immunosuppressants: Secondary | ICD-10-CM | POA: Diagnosis not present

## 2021-07-12 DIAGNOSIS — J069 Acute upper respiratory infection, unspecified: Secondary | ICD-10-CM

## 2021-07-12 DIAGNOSIS — R062 Wheezing: Secondary | ICD-10-CM

## 2021-07-12 DIAGNOSIS — M0579 Rheumatoid arthritis with rheumatoid factor of multiple sites without organ or systems involvement: Secondary | ICD-10-CM

## 2021-07-12 MED ORDER — ALBUTEROL SULFATE HFA 108 (90 BASE) MCG/ACT IN AERS: 2.0000 | INHALATION_SPRAY | Freq: Four times a day (QID) | RESPIRATORY_TRACT | 0 refills | Status: DC | PRN

## 2021-07-12 MED ORDER — AMOXICILLIN 500 MG PO TABS: 500.0000 mg | ORAL_TABLET | Freq: Two times a day (BID) | ORAL | 0 refills | Status: AC

## 2021-07-12 MED ORDER — BENZONATATE 100 MG PO CAPS: 100.0000 mg | ORAL_CAPSULE | Freq: Two times a day (BID) | ORAL | 0 refills | Status: DC | PRN

## 2021-07-12 MED ORDER — FLUTICASONE PROPIONATE 50 MCG/ACT NA SUSP: 1.0000 | Freq: Every day | NASAL | 6 refills | Status: DC

## 2021-07-12 NOTE — Telephone Encounter (Signed)
Pt is calling and she does not want benzonatate (TESSALON) 100 MG capsule she would like cough syrup  [id number] [place], [place] - [address] Phone:  [phone]  Fax:  [phone]

## 2021-07-12 NOTE — Progress Notes (Signed)
Virtual Visit via Video Note  I connected with  Lisa Chase on 07/12/21 at  9:00 AM EST by a video enabled telemedicine application 2/2 COVID-19 pandemic and verified that I am speaking with the correct person using two identifiers.  Location patient: home Location provider:work or home office Persons participating in the virtual visit: patient, provider  I discussed the limitations of evaluation and management by telemedicine and the availability of in person appointments. The patient expressed understanding and agreed to proceed.   HPI: Pt is a 51 yo female with pmh sig for RA on immune suppressant therapy, GERD, history of positive PPD was seen for acute concern.  Patient with dry cough, HA since Friday 07/09/21.  Coughing causing stomach and back pain.  Started wheezing on Sunday 2/5.  Endorses mild sore throat, Fever Tmax 102 F yesterday now 14F, and decreased appetite. Denies ear pain/pressure, diarrhea, chills. Pt's husband has been sick. Pt tried Tylenol cold and flu, benadryl Pt is on immunosuppressive therapy Enbrel for history of RA Pt had a negative COVID test yesterday 07/11/2021.   ROS: See pertinent positives and negatives per HPI.  Past Medical History:  Diagnosis Date   Arthritis    Rheumatoid dx 2010 on Methotrexate   GERD (gastroesophageal reflux disease)     History reviewed. No pertinent surgical history.  Family History  Problem Relation Age of Onset   Heart disease Mother      Current Outpatient Medications:    albuterol (PROVENTIL) (5 MG/ML) 0.5% nebulizer solution, Take 0.5 mLs (2.5 mg total) by nebulization every 6 (six) hours as needed for wheezing or shortness of breath., Disp: 20 mL, Rfl: 12   benzonatate (TESSALON PERLES) 100 MG capsule, Take 2 capsules (200 mg total) by mouth 3 (three) times daily as needed for cough., Disp: 20 capsule, Rfl: 0   etanercept (ENBREL SURECLICK) 50 MG/ML injection, INJECT 50 MG SUBCUTANEOUS ONCE WEEKLY., Disp: 4 mL,  Rfl: 2   folic acid (FOLVITE) 1 MG tablet, Take 1 mg by mouth every other day. , Disp: , Rfl:    HYDROcodone-homatropine (HYCODAN) 5-1.5 MG/5ML syrup, Take 5 mLs by mouth every 8 (eight) hours as needed for cough., Disp: 120 mL, Rfl: 0   ipratropium (ATROVENT) 0.03 % nasal spray, Place 2 sprays into both nostrils every 12 (twelve) hours., Disp: 30 mL, Rfl: 1   naproxen (NAPROSYN) 500 MG tablet, Take 500 mg by mouth as needed., Disp: , Rfl:    Pediatric Multivitamins-Fl (MULT-VITAMIN/FLUORIDE PO), , Disp: , Rfl:    promethazine-dextromethorphan (PROMETHAZINE-DM) 6.25-15 MG/5ML syrup, Take 5 mLs by mouth 4 (four) times daily as needed for cough., Disp: 118 mL, Rfl: 0   traMADol (ULTRAM) 50 MG tablet, Take 50 mg by mouth every 6 (six) hours as needed., Disp: , Rfl:    Vitamin D, Ergocalciferol, (DRISDOL) 1.25 MG (50000 UNIT) CAPS capsule, Take 1 capsule (50,000 Units total) by mouth every 7 (seven) days., Disp: 12 capsule, Rfl: 0  EXAM:  VITALS per patient if applicable:  RR 12-20 bpm,  Temp 14F  GENERAL: alert, oriented, appears well and in no acute distress  HEENT: atraumatic, conjunctiva clear, no obvious abnormalities on inspection of external nose and ears  NECK: normal movements of the head and neck  LUNGS: on inspection no signs of respiratory distress, breathing rate appears normal, no obvious gross SOB, gasping or wheezing  CV: no obvious cyanosis  MS: moves all visible extremities without noticeable abnormality  PSYCH/NEURO: pleasant and cooperative, no obvious depression  or anxiety, speech and thought processing grossly intact  ASSESSMENT AND PLAN:  Discussed the following assessment and plan:  Viral URI with cough -Symptoms likely 2/2 viral etiology, also consider bacterial infection as patient is on immunosuppressant Enbrel for RA -COVID testing - 07/11/2021.  Would repeat testing in a day or 2. -OTC cough/cold medications, gargling with warm salt water Chloraseptic spray,  rest, hydration -Prescription for Tessalon and Flonase sent to pharmacy -Albuterol inhaler for wheezing -Wait-and-see Rx for amoxicillin for continued fever/worsening symptoms - Plan: amoxicillin (AMOXIL) 500 MG tablet, benzonatate (TESSALON) 100 MG capsule, fluticasone (FLONASE) 50 MCG/ACT nasal spray, albuterol (VENTOLIN HFA) 108 (90 Base) MCG/ACT inhaler  Rheumatoid arthritis involving multiple sites with positive rheumatoid factor (HCC) -Stable -Continue Enbrel -Continue follow-up rheumatology  Long term (current) use of other immunomodulators and immunosuppressants -Continue Enbrel for history of RA - Plan: amoxicillin (AMOXIL) 500 MG tablet  Wheezing - Plan: albuterol (VENTOLIN HFA) 108 (90 Base) MCG/ACT inhaler  Follow-up as needed   I discussed the assessment and treatment plan with the patient. The patient was provided an opportunity to ask questions and all were answered. The patient agreed with the plan and demonstrated an understanding of the instructions.   The patient was advised to call back or seek an in-person evaluation if the symptoms worsen or if the condition fails to improve as anticipated.   Billie Ruddy, MD

## 2021-07-12 NOTE — Telephone Encounter (Signed)
Patient calling in with respiratory symptoms: Shortness of breath, chest pain, palpitations or other red words send to Triage  Does the patient have a fever over 100, cough, congestion, sore throat, runny nose, lost of taste/smell (please list symptoms that patient has)?cough, temp now 99.   What date did symptoms start?07-09-2021 (If over 5 days ago, pt may be scheduled for in person visit)  Have you tested for Covid in the last 5 days? Yes   If yes, was it positive []  OR negative [x] ? If positive in the last 5 days, please schedule virtual visit now. If negative, schedule for an in person OV with the next available provider if PCP has no openings. Please also let patient know they will be tested again (follow the script below)  "you will have to arrive prior to your appt time to be Covid tested. Please park in back of office at the [facility] & call [phone] to let the staff know you have arrived. A staff member will meet you at your car to do a rapid covid test. Once the test has resulted you will be notified by phone of your results to determine if appt will remain an in person visit or be converted to a virtual/phone visit. If you arrive less than before your appt time, your visit will be automatically converted to virtual & any recommended testing will happen AFTER the visit." Pt has virtual with dr banks 07-12-2021  THINGS TO REMEMBER  If no availability for virtual visit in office,  please schedule another [place] office  If no availability at another [place] office, please instruct patient that they can schedule an evisit or virtual visit through their mychart account. Visits up to 8pm  patients can be seen in office 5 days after positive COVID test

## 2021-07-12 NOTE — Telephone Encounter (Signed)
Please advise 

## 2021-07-15 ENCOUNTER — Other Ambulatory Visit (HOSPITAL_COMMUNITY): Payer: Self-pay

## 2021-07-16 ENCOUNTER — Other Ambulatory Visit: Payer: Self-pay | Admitting: Family Medicine

## 2021-07-16 DIAGNOSIS — R051 Acute cough: Secondary | ICD-10-CM

## 2021-07-16 MED ORDER — DEXTROMETHORPHAN HBR 15 MG/5ML PO SYRP: 10.0000 mL | ORAL_SOLUTION | Freq: Three times a day (TID) | ORAL | 0 refills | Status: DC | PRN

## 2021-07-16 NOTE — Telephone Encounter (Signed)
Pt call back and stated the Tessalon capsule don't work and she have not hac any sleep want a cough syrup sent in for her.

## 2021-07-19 ENCOUNTER — Other Ambulatory Visit (HOSPITAL_COMMUNITY): Payer: Self-pay

## 2021-07-21 ENCOUNTER — Other Ambulatory Visit (HOSPITAL_COMMUNITY): Payer: Self-pay

## 2021-08-10 ENCOUNTER — Other Ambulatory Visit (HOSPITAL_COMMUNITY): Payer: Self-pay

## 2021-08-17 ENCOUNTER — Other Ambulatory Visit (HOSPITAL_COMMUNITY): Payer: Self-pay

## 2021-09-07 ENCOUNTER — Other Ambulatory Visit (HOSPITAL_COMMUNITY): Payer: Self-pay

## 2021-09-08 ENCOUNTER — Other Ambulatory Visit: Payer: Self-pay | Admitting: Pharmacist

## 2021-09-08 ENCOUNTER — Other Ambulatory Visit (HOSPITAL_COMMUNITY): Payer: Self-pay

## 2021-09-08 MED ORDER — ENBREL SURECLICK 50 MG/ML ~~LOC~~ SOAJ: SUBCUTANEOUS | 0 refills | Status: DC

## 2021-09-08 MED ORDER — ENBREL SURECLICK 50 MG/ML ~~LOC~~ SOAJ
SUBCUTANEOUS | 0 refills | Status: DC
  Filled 2021-09-08: qty 4, 28d supply, fill #0
  Filled 2021-10-01: qty 4, 28d supply, fill #1
  Filled 2021-11-03: qty 4, 28d supply, fill #2

## 2021-09-09 ENCOUNTER — Other Ambulatory Visit (HOSPITAL_COMMUNITY): Payer: Self-pay

## 2021-09-14 ENCOUNTER — Other Ambulatory Visit (HOSPITAL_COMMUNITY): Payer: Self-pay

## 2021-10-01 ENCOUNTER — Other Ambulatory Visit (HOSPITAL_COMMUNITY): Payer: Self-pay

## 2021-10-11 ENCOUNTER — Other Ambulatory Visit: Payer: Self-pay | Admitting: Family Medicine

## 2021-10-11 DIAGNOSIS — Z1231 Encounter for screening mammogram for malignant neoplasm of breast: Secondary | ICD-10-CM

## 2021-10-12 ENCOUNTER — Other Ambulatory Visit (HOSPITAL_COMMUNITY): Payer: Self-pay

## 2021-10-15 ENCOUNTER — Encounter: Payer: Self-pay | Admitting: Family Medicine

## 2021-10-15 ENCOUNTER — Ambulatory Visit (INDEPENDENT_AMBULATORY_CARE_PROVIDER_SITE_OTHER): Payer: No Typology Code available for payment source

## 2021-10-15 ENCOUNTER — Ambulatory Visit (INDEPENDENT_AMBULATORY_CARE_PROVIDER_SITE_OTHER): Payer: No Typology Code available for payment source | Admitting: Family Medicine

## 2021-10-15 VITALS — BP 108/70 | HR 76 | Temp 98.5°F | Ht 63.5 in | Wt 171.8 lb

## 2021-10-15 DIAGNOSIS — R600 Localized edema: Secondary | ICD-10-CM | POA: Diagnosis not present

## 2021-10-15 DIAGNOSIS — E781 Pure hyperglyceridemia: Secondary | ICD-10-CM

## 2021-10-15 DIAGNOSIS — I959 Hypotension, unspecified: Secondary | ICD-10-CM

## 2021-10-15 DIAGNOSIS — E041 Nontoxic single thyroid nodule: Secondary | ICD-10-CM | POA: Diagnosis not present

## 2021-10-15 DIAGNOSIS — M0579 Rheumatoid arthritis with rheumatoid factor of multiple sites without organ or systems involvement: Secondary | ICD-10-CM

## 2021-10-15 DIAGNOSIS — R051 Acute cough: Secondary | ICD-10-CM | POA: Diagnosis not present

## 2021-10-15 DIAGNOSIS — Z0001 Encounter for general adult medical examination with abnormal findings: Secondary | ICD-10-CM | POA: Diagnosis not present

## 2021-10-15 DIAGNOSIS — Z1211 Encounter for screening for malignant neoplasm of colon: Secondary | ICD-10-CM

## 2021-10-15 LAB — COMPREHENSIVE METABOLIC PANEL
ALT: 72 U/L — ABNORMAL HIGH (ref 0–35)
AST: 64 U/L — ABNORMAL HIGH (ref 0–37)
Albumin: 4.4 g/dL (ref 3.5–5.2)
Alkaline Phosphatase: 57 U/L (ref 39–117)
BUN: 11 mg/dL (ref 6–23)
CO2: 27 mEq/L (ref 19–32)
Calcium: 9.7 mg/dL (ref 8.4–10.5)
Chloride: 105 mEq/L (ref 96–112)
Creatinine, Ser: 1.15 mg/dL (ref 0.40–1.20)
GFR: 55.27 mL/min — ABNORMAL LOW (ref >60.00)
Glucose, Bld: 109 mg/dL — ABNORMAL HIGH (ref 70–99)
Potassium: 4.1 mEq/L (ref 3.5–5.1)
Sodium: 140 mEq/L (ref 135–145)
Total Bilirubin: 0.6 mg/dL (ref 0.2–1.2)
Total Protein: 8 g/dL (ref 6.0–8.3)

## 2021-10-15 LAB — CBC WITH DIFFERENTIAL/PLATELET
Basophils Absolute: 0.1 10*3/uL (ref 0.0–0.1)
Basophils Relative: 0.7 % (ref 0.0–3.0)
Eosinophils Absolute: 0.2 10*3/uL (ref 0.0–0.7)
Eosinophils Relative: 2.3 % (ref 0.0–5.0)
HCT: 45.5 % (ref 36.0–46.0)
Hemoglobin: 15.5 g/dL — ABNORMAL HIGH (ref 12.0–15.0)
Lymphocytes Relative: 53.9 % — ABNORMAL HIGH (ref 12.0–46.0)
Lymphs Abs: 3.7 10*3/uL (ref 0.7–4.0)
MCHC: 34.1 g/dL (ref 30.0–36.0)
MCV: 89.5 fl (ref 78.0–100.0)
Monocytes Absolute: 0.8 10*3/uL (ref 0.1–1.0)
Monocytes Relative: 11.3 % (ref 3.0–12.0)
Neutro Abs: 2.2 10*3/uL (ref 1.4–7.7)
Neutrophils Relative %: 31.8 % — ABNORMAL LOW (ref 43.0–77.0)
Platelets: 277 10*3/uL (ref 150.0–400.0)
RBC: 5.09 Mil/uL (ref 3.87–5.11)
RDW: 13.5 % (ref 11.5–15.5)
WBC: 6.8 10*3/uL (ref 4.0–10.5)

## 2021-10-15 LAB — HEMOGLOBIN A1C: Hgb A1c MFr Bld: 5.8 % (ref 4.6–6.5)

## 2021-10-15 LAB — LIPID PANEL
Cholesterol: 125 mg/dL (ref 0–200)
HDL: 29.4 mg/dL — ABNORMAL LOW (ref >39.00)
LDL Cholesterol: 56 mg/dL (ref 0–99)
NonHDL: 95.15
Total CHOL/HDL Ratio: 4
Triglycerides: 197 mg/dL — ABNORMAL HIGH (ref 0.0–149.0)
VLDL: 39.4 mg/dL (ref 0.0–40.0)

## 2021-10-15 LAB — T4, FREE: Free T4: 0.9 ng/dL (ref 0.60–1.60)

## 2021-10-15 LAB — TSH: TSH: 2.29 u[IU]/mL (ref 0.35–5.50)

## 2021-10-15 MED ORDER — ALBUTEROL SULFATE HFA 108 (90 BASE) MCG/ACT IN AERS: 2.0000 | INHALATION_SPRAY | Freq: Four times a day (QID) | RESPIRATORY_TRACT | 65 refills | Status: DC | PRN

## 2021-10-15 MED ORDER — HYDROCODONE BIT-HOMATROP MBR 5-1.5 MG/5ML PO SOLN: 5.0000 mL | Freq: Three times a day (TID) | ORAL | 0 refills | Status: DC | PRN

## 2021-10-15 NOTE — Patient Instructions (Addendum)
Prescriptions were sent in for cough syrup, albuterol inhaler. ? ?Orders for labs and a chest x-ray were also placed.  Based on x-ray results if an antibiotic is needed we will call went into your local pharmacy.  If symptoms become worse over the weekend please proceed to the nearest urgent care or ED. ? ?A referral was placed for you to have a thyroid ultrasound at some point to check on the nodule. ? ?An order for Cologuard and at home testing kit was placed for colon cancer screening.  Your insurance is not charged until you send the kit back in for processing. ?

## 2021-10-15 NOTE — Progress Notes (Signed)
Subjective:  ?  ? Lisa Chase is a 51 y.o. female and is here for a comprehensive physical exam. The patient reports several concerns.  Pt noticed L foot edema x several days.  Denies pain in LLE or injury.  States edema comes and goes.  Pt does a lot of sitting as she is working from home.  RLE without edema.  Pt also mentions edema in hands over the last few months.  Previously well controlled on Embrel for RA, not sure if med is working as well.  Pt with dry persistent cough x 10 days. Also had a sore throat when cough started.  States symptoms seem to happen with the change in weather from hot to cold or cold to hot.  Given recent symptoms pt is try to increase p.o. intake of water and fluids.  Notes BP today lower than her normal.  Patient's BP typically 1 teens-120s/70.  Last mammogram 12/18/2020. ? ?Social History  ? ?Socioeconomic History  ? Marital status: Married  ?  Spouse name: Not on file  ? Number of children: Not on file  ? Years of education: Not on file  ? Highest education level: Not on file  ?Occupational History  ? Not on file  ?Tobacco Use  ? Smoking status: Never  ? Smokeless tobacco: Never  ?Substance and Sexual Activity  ? Alcohol use: No  ? Drug use: No  ? Sexual activity: Yes  ?Other Topics Concern  ? Not on file  ?Social History Narrative  ? Not on file  ? ?Social Determinants of Health  ? ?Financial Resource Strain: Not on file  ?Food Insecurity: Not on file  ?Transportation Needs: Not on file  ?Physical Activity: Not on file  ?Stress: Not on file  ?Social Connections: Not on file  ?Intimate Partner Violence: Not on file  ? ?Health Maintenance  ?Topic Date Due  ? HIV Screening  Never done  ? Zoster Vaccines- Shingrix (1 of 2) Never done  ? COLONOSCOPY (Pts 45-68yrs Insurance coverage will need to be confirmed)  Never done  ? COVID-19 Vaccine (4 - Booster for Pfizer series) 05/22/2020  ? PAP SMEAR-Modifier  10/15/2021 (Originally 07[birth year]1991)  ? INFLUENZA VACCINE  01/04/2022  ?  MAMMOGRAM  12/19/2022  ? TETANUS/TDAP  07/06/2026  ? Hepatitis C Screening  Completed  ? HPV VACCINES  Aged Out  ? ? ?The following portions of the patient's history were reviewed and updated as appropriate: allergies, current medications, past family history, past medical history, past social history, past surgical history, and problem list. ?Review of Systems ?Pertinent items noted in HPI and remainder of comprehensive ROS otherwise negative.  ? ?Objective:  ? ? BP 108/70 (BP Location: Left Arm, Patient Position: Sitting, Cuff Size: Normal)   Pulse 76   Temp 98.5 ?F (36.9 ?C) (Oral)   Ht 5' 3.5" (1.613 m)   Wt 171 lb 12.8 oz (77.9 kg)   SpO2 98%   BMI 29.96 kg/m?  ?General appearance: alert, cooperative, and no distress ?Head: Normocephalic, without obvious abnormality, atraumatic ?Eyes: conjunctivae/corneas clear. PERRL, EOM's intact. Fundi benign. ?Ears: normal TM's and external ear canals both ears ?Nose: Nares normal. Septum midline. Mucosa normal. No drainage or sinus tenderness. ?Throat: lips, mucosa, and tongue normal; teeth and gums normal ?Neck: no adenopathy, no carotid bruit, no JVD, supple, symmetrical, trachea midline, and thyroid not enlarged, symmetric, no tenderness/mass/nodules ?Lungs: clear to auscultation bilaterally ?Heart: regular rate and rhythm, S1, S2 normal, no murmur, click, rub or gallop  nonpitting left pedal edema to ankle.  No calf tenderness bilaterally.  Negative Homans' sign bilaterally.  No erythema or TTP of bilateral LEs. ?Abdomen: soft, non-tender; bowel sounds normal; no masses,  no organomegaly ?Extremities:  L nonpitting pedal edema, no edema in LLE above ankle or RLE. 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  ?Pulses: 2+ and symmetric ?Skin: Skin color, texture, turgor normal. No rashes or lesions ?Lymph nodes: Cervical, supraclavicular, and axillary nodes normal. ?Neurologic: Alert and oriented X 3, normal strength and tone. Normal symmetric reflexes. Normal  coordination and gait  ?  ?Assessment:  ? ? Healthy female exam with abnormal findings including thyroid nodule vs enlarged lymph node in base of R neck, hypotension, L pedal edema, cough.   ?  ?Plan:  ?Encounter for routine adult health examination with abnormal findings  ? Anticipatory guidance given including wearing seatbelts, smoke detectors in the home, increasing physical activity, increasing p.o. intake of water and vegetables. ?-will obtain labs ?-Patient is to schedule mammogram later this year.  Last done 12/18/2020 ?-Colonoscopy due.  Order for Cologuard placed. ?-Pap due. ?-Immunizations reviewed ?-Given handout ?-Next CPE in 1 year ?See After Visit Summary for Counseling Recommendations  ?- Plan: Hemoglobin A1c ? ?Acute cough ?-Discussed various causes of cough including viral versus bacterial etiology.  Also consider seasonal allergies ?-Given duration of cough obtain CXR.--CXR results normal ?-Given precautions ? - Plan: DG Chest 2 View, albuterol (VENTOLIN HFA) 108 (90 Base) MCG/ACT inhaler, HYDROcodone bit-homatropine (HYCODAN) 5-1.5 MG/5ML syrup ? ?Edema of left foot ?-Pedal edema likely 2/2 RA and left ankle. ?-Discussed elevating LEs when sitting, decreasing sodium intake, wearing ted hose or compression socks ?-Unable to use short-term diuretic 2/2 hypotension. ?-Also consider other causes such as DVT-though exam reassuring. ? - Plan: CBC with Differential/Platelet ? ?Rheumatoid arthritis involving multiple sites with positive rheumatoid factor (HCC)  ?-Patient reports increased swelling in hands and left foot.  Concern Embrel no longer as effective. ?-For now continue current medications ?-Continue follow-up with rheumatology ?- Plan: CBC with Differential/Platelet ? ?Thyroid nodule  ?-Nodule in base of right neck noted on exam. ?-Concern for thyroid nodule versus enlarged lymph node ?- Plan: TSH, T4, Free, US THYROID ? ?Pure hypertriglyceridemia  ?Continue lifestyle modification ?-Continued  elevation in cholesterol consider statin or fish oil tablets. ?- Plan: Lipid panel, CMP ? ?Screen for colon cancer ? - Plan: Cologuard ? ?Hypotension, unspecified hypotension type ?-Noted this visit ?-Possible causes including current acute illness versus decreased p.o. intake the patient states she has increased p.o. intake.  Also consider medications ?-We will obtain labs to evaluate for acute infection ?-Given strict precautions ? ?Follow-up as needed ? ?Abbe Amsterdam, MD ? ? ?

## 2021-10-18 ENCOUNTER — Other Ambulatory Visit: Payer: Self-pay

## 2021-10-18 DIAGNOSIS — R7989 Other specified abnormal findings of blood chemistry: Secondary | ICD-10-CM

## 2021-10-22 ENCOUNTER — Ambulatory Visit (HOSPITAL_BASED_OUTPATIENT_CLINIC_OR_DEPARTMENT_OTHER)
Admission: RE | Admit: 2021-10-22 | Discharge: 2021-10-22 | Disposition: A | Discharge status: Home / Self Care | Payer: No Typology Code available for payment source | Source: Ambulatory Visit | Attending: Family Medicine | Admitting: Family Medicine

## 2021-10-22 DIAGNOSIS — E041 Nontoxic single thyroid nodule: Secondary | ICD-10-CM | POA: Insufficient documentation

## 2021-10-26 ENCOUNTER — Other Ambulatory Visit (INDEPENDENT_AMBULATORY_CARE_PROVIDER_SITE_OTHER): Payer: No Typology Code available for payment source

## 2021-10-26 DIAGNOSIS — R7989 Other specified abnormal findings of blood chemistry: Secondary | ICD-10-CM

## 2021-10-26 LAB — COMPREHENSIVE METABOLIC PANEL
ALT: 62 U/L — ABNORMAL HIGH (ref 0–35)
AST: 59 U/L — ABNORMAL HIGH (ref 0–37)
Albumin: 4.3 g/dL (ref 3.5–5.2)
Alkaline Phosphatase: 50 U/L (ref 39–117)
BUN: 11 mg/dL (ref 6–23)
CO2: 27 mEq/L (ref 19–32)
Calcium: 9.4 mg/dL (ref 8.4–10.5)
Chloride: 107 mEq/L (ref 96–112)
Creatinine, Ser: 1.09 mg/dL (ref 0.40–1.20)
GFR: 58.92 mL/min — ABNORMAL LOW (ref >60.00)
Glucose, Bld: 104 mg/dL — ABNORMAL HIGH (ref 70–99)
Potassium: 3.9 mEq/L (ref 3.5–5.1)
Sodium: 140 mEq/L (ref 135–145)
Total Bilirubin: 0.7 mg/dL (ref 0.2–1.2)
Total Protein: 7.6 g/dL (ref 6.0–8.3)

## 2021-11-03 ENCOUNTER — Other Ambulatory Visit (HOSPITAL_COMMUNITY): Payer: Self-pay

## 2021-11-03 ENCOUNTER — Other Ambulatory Visit: Payer: No Typology Code available for payment source

## 2021-11-09 ENCOUNTER — Other Ambulatory Visit (HOSPITAL_COMMUNITY): Payer: Self-pay

## 2021-11-29 ENCOUNTER — Other Ambulatory Visit (HOSPITAL_COMMUNITY): Payer: Self-pay

## 2021-12-15 ENCOUNTER — Ambulatory Visit: Payer: No Typology Code available for payment source | Attending: Family Medicine | Admitting: Pharmacist

## 2021-12-15 ENCOUNTER — Other Ambulatory Visit (HOSPITAL_COMMUNITY): Payer: Self-pay

## 2021-12-15 DIAGNOSIS — Z79899 Other long term (current) drug therapy: Secondary | ICD-10-CM

## 2021-12-15 MED ORDER — ENBREL SURECLICK 50 MG/ML ~~LOC~~ SOAJ
SUBCUTANEOUS | 0 refills | Status: DC
  Filled 2021-12-15: qty 4, 28d supply, fill #0
  Filled 2022-01-17: qty 4, 28d supply, fill #1
  Filled 2022-02-14: qty 4, 28d supply, fill #2

## 2021-12-15 MED ORDER — ENBREL SURECLICK 50 MG/ML ~~LOC~~ SOAJ: SUBCUTANEOUS | 0 refills | Status: DC

## 2021-12-15 NOTE — Addendum Note (Signed)
Addended by: Lois Huxley, Jeannett Senior L on: 12/15/2021 04:50 PM   Modules accepted: Orders

## 2021-12-15 NOTE — Progress Notes (Signed)
  S: Patient presents for review of their specialty medication therapy.  Patient is currently taking Enbrel for RA. Patient is managed by Dr. Kathi Ludwig for this.  Dosing: RA - SubQ: Note: May continue methotrexate, glucocorticoids, salicylates, NSAIDs, or analgesics during etanercept therapy. Once-weekly dosing: 50 mg once weekly  Monitoring: Injection site reactions: reports occasional swelling/redness; resolves quickly and rheumatologist is aware S/sx of infections: denies  S/sx of malignancy: denies GI upset: denies   O: Lab Results  Component Value Date   WBC 6.8 10/15/2021   HGB 15.5 (H) 10/15/2021   HCT 45.5 10/15/2021   MCV 89.5 10/15/2021   PLT 277.0 10/15/2021      Chemistry      Component Value Date/Time   NA 140 10/26/2021 0823   K 3.9 10/26/2021 0823   CL 107 10/26/2021 0823   CO2 27 10/26/2021 0823   BUN 11 10/26/2021 0823   CREATININE 1.09 10/26/2021 0823   CREATININE 0.92 08/22/2012 2053      Component Value Date/Time   CALCIUM 9.4 10/26/2021 0823   ALKPHOS 50 10/26/2021 0823   AST 59 (H) 10/26/2021 0823   ALT 62 (H) 10/26/2021 0823   BILITOT 0.7 10/26/2021 0823     A/P: 1. Medication review: patient currently on Enbrel for RA and is tolerating it. Reviewed the medication with the patient, including the following: Enbrel (etanercept) binds tumor necrosis factor (TNF) and blocks its interaction with cell surface receptors. TNF plays an important role in the inflammatory processes of many diseases. Patient educated on purpose, proper use and potential adverse effects of Enbrel. Adverse effects include rash, GI upset, increased risk of infection, and injection site reactions. Patients should stop Enbrel if they develop a serious infection. There is a possible increased risk in lymphoma and other malignancies. No recommendations for any changes.   Butch Penny, PharmD, Patsy [place], CPP Clinical Pharmacist [facility] [phone]

## 2021-12-16 ENCOUNTER — Other Ambulatory Visit (HOSPITAL_COMMUNITY): Payer: Self-pay

## 2021-12-22 ENCOUNTER — Ambulatory Visit
Admission: RE | Admit: 2021-12-22 | Discharge: 2021-12-22 | Disposition: A | Discharge status: Home / Self Care | Payer: No Typology Code available for payment source | Source: Ambulatory Visit | Attending: Family Medicine | Admitting: Family Medicine

## 2021-12-22 DIAGNOSIS — Z1231 Encounter for screening mammogram for malignant neoplasm of breast: Secondary | ICD-10-CM

## 2022-01-17 ENCOUNTER — Other Ambulatory Visit (HOSPITAL_COMMUNITY): Payer: Self-pay

## 2022-01-18 ENCOUNTER — Other Ambulatory Visit (HOSPITAL_COMMUNITY): Payer: Self-pay

## 2022-02-10 ENCOUNTER — Other Ambulatory Visit (HOSPITAL_COMMUNITY): Payer: Self-pay

## 2022-02-14 ENCOUNTER — Other Ambulatory Visit (HOSPITAL_COMMUNITY): Payer: Self-pay

## 2022-02-16 ENCOUNTER — Other Ambulatory Visit (HOSPITAL_COMMUNITY): Payer: Self-pay

## 2022-03-09 ENCOUNTER — Other Ambulatory Visit (HOSPITAL_COMMUNITY): Payer: Self-pay

## 2022-03-09 ENCOUNTER — Other Ambulatory Visit: Payer: Self-pay | Admitting: Internal Medicine

## 2022-03-09 NOTE — Telephone Encounter (Signed)
Requested medication (s) are due for refill today: yes  Requested medication (s) are on the active medication list: yes  Last refill:  12/15/21 #12/0  Future visit scheduled: no  Notes to clinic:  Unable to refill per protocol, cannot delegate. And Unable to refill per protocol, last refill by another provider.      Requested Prescriptions  Pending Prescriptions Disp Refills   etanercept (ENBREL SURECLICK) 50 MG/ML injection 12 mL 0    Sig: inject 50 mg (49ml) under the skin once weekly     Not Delegated - Immunology: Tumor Necrosis Factor Blockers Failed - 03/09/2022 12:44 PM      Failed - This refill cannot be delegated      Failed - Patient should have a PPD Skin Test annually.      Failed - AST in normal range and within 180 days    AST  Date Value Ref Range Status  10/26/2021 59 (H) 0 - 37 U/L Final         Failed - ALT in normal range and within 180 days    ALT  Date Value Ref Range Status  10/26/2021 62 (H) 0 - 35 U/L Final         Failed - HGB in normal range and within 180 days    Hemoglobin  Date Value Ref Range Status  10/15/2021 15.5 (H) 12.0 - 15.0 g/dL Final         Passed - HCT in normal range and within 180 days    HCT  Date Value Ref Range Status  10/15/2021 45.5 36.0 - 46.0 % Final         Passed - PLT in normal range and within 180 days    Platelets  Date Value Ref Range Status  10/15/2021 277.0 150.0 - 400.0 K/uL Final   Platelet Count, POC  Date Value Ref Range Status  08/22/2012 394 142 - 424 K/uL Final         Passed - WBC in normal range and within 180 days    WBC  Date Value Ref Range Status  10/15/2021 6.8 4.0 - 10.5 K/uL Final         Passed - Valid encounter within last 6 months    Recent Outpatient Visits           2 months ago Medication management   [place], RPH-CPP   1 year ago Medication management   [place], RPH-CPP   2 years ago Medication management   [place], RPH-CPP   9 years ago Neck pain on left side   [facility], Thao P, DO

## 2022-03-10 ENCOUNTER — Other Ambulatory Visit: Payer: Self-pay | Admitting: Pharmacist

## 2022-03-10 ENCOUNTER — Other Ambulatory Visit (HOSPITAL_COMMUNITY): Payer: Self-pay

## 2022-03-10 MED ORDER — ENBREL SURECLICK 50 MG/ML ~~LOC~~ SOAJ
50.0000 mg | SUBCUTANEOUS | 0 refills | Status: DC
  Filled 2022-03-10: qty 12, 84d supply, fill #0
  Filled 2022-03-10: qty 4, 28d supply, fill #0

## 2022-03-10 MED ORDER — ENBREL SURECLICK 50 MG/ML ~~LOC~~ SOAJ
50.0000 mg | SUBCUTANEOUS | 0 refills | Status: DC
  Filled 2022-03-10: qty 4, 28d supply, fill #0
  Filled 2022-03-30: qty 4, 28d supply, fill #1
  Filled 2022-04-26: qty 4, 28d supply, fill #2

## 2022-03-30 ENCOUNTER — Other Ambulatory Visit (HOSPITAL_COMMUNITY): Payer: Self-pay

## 2022-04-04 ENCOUNTER — Other Ambulatory Visit (HOSPITAL_COMMUNITY): Payer: Self-pay

## 2022-04-22 ENCOUNTER — Other Ambulatory Visit (HOSPITAL_COMMUNITY): Payer: Self-pay

## 2022-04-26 ENCOUNTER — Other Ambulatory Visit (HOSPITAL_COMMUNITY): Payer: Self-pay

## 2022-05-04 ENCOUNTER — Other Ambulatory Visit (HOSPITAL_COMMUNITY): Payer: Self-pay

## 2022-05-05 ENCOUNTER — Other Ambulatory Visit (HOSPITAL_COMMUNITY): Payer: Self-pay

## 2022-05-09 ENCOUNTER — Other Ambulatory Visit (HOSPITAL_COMMUNITY): Payer: Self-pay

## 2022-05-24 ENCOUNTER — Other Ambulatory Visit: Payer: Self-pay

## 2022-05-25 ENCOUNTER — Other Ambulatory Visit (HOSPITAL_COMMUNITY): Payer: Self-pay

## 2022-05-25 ENCOUNTER — Other Ambulatory Visit: Payer: Self-pay | Admitting: Pharmacist

## 2022-05-25 ENCOUNTER — Other Ambulatory Visit: Payer: Self-pay

## 2022-05-25 MED ORDER — ENBREL SURECLICK 50 MG/ML ~~LOC~~ SOAJ
50.0000 mg | SUBCUTANEOUS | 2 refills | Status: DC
  Filled 2022-05-25: qty 4, 28d supply, fill #0
  Filled 2022-06-21: qty 4, 28d supply, fill #1
  Filled 2022-07-21: qty 4, 28d supply, fill #2
  Filled 2022-08-19: qty 4, 28d supply, fill #3
  Filled 2022-09-22: qty 4, 28d supply, fill #4
  Filled 2022-10-20: qty 4, 28d supply, fill #5
  Filled 2022-11-16: qty 4, 28d supply, fill #6
  Filled 2022-12-15: qty 4, 28d supply, fill #7
  Filled 2023-01-13: qty 4, 28d supply, fill #8

## 2022-05-25 MED ORDER — ENBREL SURECLICK 50 MG/ML ~~LOC~~ SOAJ: 50.0000 mg | SUBCUTANEOUS | 2 refills | Status: DC

## 2022-05-31 ENCOUNTER — Other Ambulatory Visit (HOSPITAL_COMMUNITY): Payer: Self-pay

## 2022-06-21 ENCOUNTER — Other Ambulatory Visit (HOSPITAL_COMMUNITY): Payer: Self-pay

## 2022-06-28 ENCOUNTER — Other Ambulatory Visit: Payer: Self-pay

## 2022-07-21 ENCOUNTER — Other Ambulatory Visit (HOSPITAL_COMMUNITY): Payer: Self-pay

## 2022-07-25 ENCOUNTER — Other Ambulatory Visit (HOSPITAL_COMMUNITY): Payer: Self-pay

## 2022-07-25 ENCOUNTER — Other Ambulatory Visit: Payer: Self-pay

## 2022-07-25 DIAGNOSIS — Z79899 Other long term (current) drug therapy: Secondary | ICD-10-CM | POA: Diagnosis not present

## 2022-07-25 DIAGNOSIS — R945 Abnormal results of liver function studies: Secondary | ICD-10-CM | POA: Diagnosis not present

## 2022-07-25 DIAGNOSIS — R7611 Nonspecific reaction to tuberculin skin test without active tuberculosis: Secondary | ICD-10-CM | POA: Diagnosis not present

## 2022-07-25 DIAGNOSIS — M0589 Other rheumatoid arthritis with rheumatoid factor of multiple sites: Secondary | ICD-10-CM | POA: Diagnosis not present

## 2022-07-25 MED ORDER — PREDNISONE 5 MG PO TABS
5.0000 mg | ORAL_TABLET | Freq: Every day | ORAL | 1 refills | Status: DC
  Filled 2022-07-25: qty 30, 30d supply, fill #0
  Filled 2022-08-19: qty 30, 30d supply, fill #1

## 2022-08-17 ENCOUNTER — Other Ambulatory Visit (HOSPITAL_COMMUNITY): Payer: Self-pay

## 2022-08-19 ENCOUNTER — Other Ambulatory Visit: Payer: Self-pay

## 2022-08-19 ENCOUNTER — Other Ambulatory Visit (HOSPITAL_COMMUNITY): Payer: Self-pay

## 2022-08-29 ENCOUNTER — Other Ambulatory Visit: Payer: Self-pay

## 2022-09-22 ENCOUNTER — Other Ambulatory Visit (HOSPITAL_COMMUNITY): Payer: Self-pay

## 2022-09-23 ENCOUNTER — Other Ambulatory Visit: Payer: Self-pay

## 2022-09-26 ENCOUNTER — Other Ambulatory Visit (HOSPITAL_COMMUNITY): Payer: Self-pay

## 2022-10-11 ENCOUNTER — Other Ambulatory Visit: Payer: Self-pay | Admitting: Family Medicine

## 2022-10-11 DIAGNOSIS — Z1231 Encounter for screening mammogram for malignant neoplasm of breast: Secondary | ICD-10-CM

## 2022-10-17 ENCOUNTER — Encounter: Payer: Self-pay | Admitting: Family Medicine

## 2022-10-17 ENCOUNTER — Ambulatory Visit (INDEPENDENT_AMBULATORY_CARE_PROVIDER_SITE_OTHER): Payer: 59 | Admitting: Family Medicine

## 2022-10-17 VITALS — BP 130/80 | HR 74 | Temp 97.9°F | Ht 63.5 in | Wt 173.6 lb

## 2022-10-17 DIAGNOSIS — E781 Pure hyperglyceridemia: Secondary | ICD-10-CM | POA: Diagnosis not present

## 2022-10-17 DIAGNOSIS — Z0000 Encounter for general adult medical examination without abnormal findings: Secondary | ICD-10-CM | POA: Diagnosis not present

## 2022-10-17 DIAGNOSIS — J302 Other seasonal allergic rhinitis: Secondary | ICD-10-CM | POA: Diagnosis not present

## 2022-10-17 DIAGNOSIS — Z1211 Encounter for screening for malignant neoplasm of colon: Secondary | ICD-10-CM

## 2022-10-17 DIAGNOSIS — R7989 Other specified abnormal findings of blood chemistry: Secondary | ICD-10-CM | POA: Diagnosis not present

## 2022-10-17 DIAGNOSIS — M0579 Rheumatoid arthritis with rheumatoid factor of multiple sites without organ or systems involvement: Secondary | ICD-10-CM

## 2022-10-17 DIAGNOSIS — E559 Vitamin D deficiency, unspecified: Secondary | ICD-10-CM | POA: Diagnosis not present

## 2022-10-17 NOTE — Assessment & Plan Note (Signed)
Continue Enbrel.  Prednisone as needed.  Followed by rheumatology.

## 2022-10-17 NOTE — Progress Notes (Signed)
Established Patient Office Visit   Subjective  Patient ID: Lisa Chase, female    DOB: 1970/12/13  Age: 52 y.o. MRN: [id number]  Chief Complaint  Patient presents with   Annual Exam    Pt is a 52 year old female with pmh sig for rheumatoid arthritis who is seen for CPE. Pt states she has been doing ok.  Has increased fatigue and joint discomfort with increased humidity.  Taking Enbrel for RA.  Followed by Rheumatology.  Given prednisone 5 mg prn for flares.  Using sparingly as does not like taking prednisone.  Pt having some continued allergy symptoms despite taking Zyrtec qhs and using flonase.  L ear felt "like it had water in it" for 3 days.  Denies L ear pain/pressure, facial pain/pressure.    Past Medical History:  Diagnosis Date   Arthritis    Rheumatoid dx 2010 on Methotrexate   GERD (gastroesophageal reflux disease)    No past surgical history on file. Social History   Tobacco Use   Smoking status: Never   Smokeless tobacco: Never  Substance Use Topics   Alcohol use: No   Drug use: No   Family History  Problem Relation Age of Onset   Heart disease Mother    No Known Allergies    ROS Negative unless stated above    Objective:     BP 130/80 (BP Location: Left Arm, Patient Position: Sitting, Cuff Size: Normal)   Pulse 74   Temp 97.9 F (36.6 C) (Oral)   Ht 5' 3.5" (1.613 m)   Wt 173 lb 9.6 oz (78.7 kg)   SpO2 94%   BMI 30.27 kg/m    Physical Exam Constitutional:      Appearance: Normal appearance.  HENT:     Head: Normocephalic and atraumatic.     Right Ear: Tympanic membrane, ear canal and external ear normal.     Left Ear: Tympanic membrane, ear canal and external ear normal.     Nose: Nose normal.     Mouth/Throat:     Mouth: Mucous membranes are moist.     Pharynx: No oropharyngeal exudate or posterior oropharyngeal erythema.  Eyes:     General: No scleral icterus.    Extraocular Movements: Extraocular movements intact.      Conjunctiva/sclera: Conjunctivae normal.     Pupils: Pupils are equal, round, and reactive to light.  Neck:     Thyroid: No thyromegaly.  Cardiovascular:     Rate and Rhythm: Normal rate and regular rhythm.     Pulses: Normal pulses.     Heart sounds: Normal heart sounds. No murmur heard.    No friction rub.  Pulmonary:     Effort: Pulmonary effort is normal.     Breath sounds: Normal breath sounds. No wheezing, rhonchi or rales.  Abdominal:     General: Bowel sounds are normal.     Palpations: Abdomen is soft.     Tenderness: There is no abdominal tenderness.  Musculoskeletal:        General: No deformity. Normal range of motion.  Lymphadenopathy:     Cervical: No cervical adenopathy.  Skin:    General: Skin is warm and dry.     Findings: No lesion.  Neurological:     General: No focal deficit present.     Mental Status: She is alert and oriented to person, place, and time.  Psychiatric:        Mood and Affect: Mood normal.  Thought Content: Thought content normal.      No results found for any visits on 10/17/22.    Assessment & Plan:  Well adult exam -Age-appropriate health screenings advised -Mammogram up-to-date, done 12/22/2021.  Upcoming study scheduled 12/26/2022 -Cologuard ordered -Pt declines pap at this time -Immunizations reviewed       -     CBC with Differential/Platelet; Future -     TSH; Future -     T4, free; Future -     Hemoglobin A1c; Future -     Lipid panel; Future  Screen for colon cancer -     Cologuard  Rheumatoid arthritis involving multiple sites with positive rheumatoid factor (HCC) -stable -Continue Enbrel -Prednisone 5 mg as needed -f/u with Rheumatology -     CBC with Differential/Platelet; Future -     Comprehensive metabolic panel; Future  Elevated liver function tests -asymptomatic -AST 59 and ALT 62 on 10/26/21. -For continued elevation obtain RUQ ultrasound -     Comprehensive metabolic panel; Future  Pure  hypertriglyceridemia -Total cholesterol 125, HDL 29.4, LDL 56, and Triglycerides 161 on 10/15/21. -Lifestyle modifications encouraged. -     Lipid panel; Future -     Comprehensive metabolic panel; Future  Vitamin D deficiency -vitamin D 20.35 10/14/20 -     VITAMIN D 25 Hydroxy (Vit-D Deficiency, Fractures); Future  Seasonal allergies -Consider switching antihistamine from Zyrtec to another OTC medication -Also consider nasal spray instead of Flonase as may have developed tolerance -If still no improvement symptoms consider Rx for Singulair and allergist referral.  Return if symptoms worsen or fail to improve.  Follow-up in 1 year for CPE.  Deeann Saint, MD

## 2022-10-20 ENCOUNTER — Other Ambulatory Visit (HOSPITAL_COMMUNITY): Payer: Self-pay

## 2022-10-21 ENCOUNTER — Other Ambulatory Visit: Payer: 59

## 2022-10-21 DIAGNOSIS — R7989 Other specified abnormal findings of blood chemistry: Secondary | ICD-10-CM

## 2022-10-21 DIAGNOSIS — M0579 Rheumatoid arthritis with rheumatoid factor of multiple sites without organ or systems involvement: Secondary | ICD-10-CM

## 2022-10-21 DIAGNOSIS — E559 Vitamin D deficiency, unspecified: Secondary | ICD-10-CM

## 2022-10-21 DIAGNOSIS — E781 Pure hyperglyceridemia: Secondary | ICD-10-CM

## 2022-10-21 DIAGNOSIS — Z0000 Encounter for general adult medical examination without abnormal findings: Secondary | ICD-10-CM

## 2022-10-21 LAB — COMPREHENSIVE METABOLIC PANEL
ALT: 55 U/L — ABNORMAL HIGH (ref 0–35)
AST: 52 U/L — ABNORMAL HIGH (ref 0–37)
Albumin: 4.2 g/dL (ref 3.5–5.2)
Alkaline Phosphatase: 58 U/L (ref 39–117)
BUN: 11 mg/dL (ref 6–23)
CO2: 26 mEq/L (ref 19–32)
Calcium: 9.5 mg/dL (ref 8.4–10.5)
Chloride: 106 mEq/L (ref 96–112)
Creatinine, Ser: 1 mg/dL (ref 0.40–1.20)
GFR: 64.89 mL/min (ref >60.00)
Glucose, Bld: 121 mg/dL — ABNORMAL HIGH (ref 70–99)
Potassium: 4 mEq/L (ref 3.5–5.1)
Sodium: 140 mEq/L (ref 135–145)
Total Bilirubin: 0.8 mg/dL (ref 0.2–1.2)
Total Protein: 7.8 g/dL (ref 6.0–8.3)

## 2022-10-21 LAB — CBC WITH DIFFERENTIAL/PLATELET
Basophils Absolute: 0 10*3/uL (ref 0.0–0.1)
Basophils Relative: 0.8 % (ref 0.0–3.0)
Eosinophils Absolute: 0.1 10*3/uL (ref 0.0–0.7)
Eosinophils Relative: 1.8 % (ref 0.0–5.0)
HCT: 46 % (ref 36.0–46.0)
Hemoglobin: 16 g/dL — ABNORMAL HIGH (ref 12.0–15.0)
Lymphocytes Relative: 50 % — ABNORMAL HIGH (ref 12.0–46.0)
Lymphs Abs: 3.1 10*3/uL (ref 0.7–4.0)
MCHC: 34.7 g/dL (ref 30.0–36.0)
MCV: 89.4 fl (ref 78.0–100.0)
Monocytes Absolute: 0.7 10*3/uL (ref 0.1–1.0)
Monocytes Relative: 10.6 % (ref 3.0–12.0)
Neutro Abs: 2.3 10*3/uL (ref 1.4–7.7)
Neutrophils Relative %: 36.8 % — ABNORMAL LOW (ref 43.0–77.0)
Platelets: 253 10*3/uL (ref 150.0–400.0)
RBC: 5.15 Mil/uL — ABNORMAL HIGH (ref 3.87–5.11)
RDW: 13.4 % (ref 11.5–15.5)
WBC: 6.3 10*3/uL (ref 4.0–10.5)

## 2022-10-21 LAB — LIPID PANEL
Cholesterol: 113 mg/dL (ref 0–200)
HDL: 32.3 mg/dL — ABNORMAL LOW (ref >39.00)
LDL Cholesterol: 56 mg/dL (ref 0–99)
NonHDL: 80.92
Total CHOL/HDL Ratio: 4
Triglycerides: 126 mg/dL (ref 0.0–149.0)
VLDL: 25.2 mg/dL (ref 0.0–40.0)

## 2022-10-21 LAB — T4, FREE: Free T4: 0.85 ng/dL (ref 0.60–1.60)

## 2022-10-21 LAB — VITAMIN D 25 HYDROXY (VIT D DEFICIENCY, FRACTURES): VITD: 40.8 ng/mL (ref 30.00–100.00)

## 2022-10-21 LAB — HEMOGLOBIN A1C: Hgb A1c MFr Bld: 6.1 % (ref 4.6–6.5)

## 2022-10-21 LAB — TSH: TSH: 2.33 u[IU]/mL (ref 0.35–5.50)

## 2022-10-24 ENCOUNTER — Other Ambulatory Visit (HOSPITAL_COMMUNITY): Payer: Self-pay

## 2022-11-16 ENCOUNTER — Other Ambulatory Visit (HOSPITAL_COMMUNITY): Payer: Self-pay

## 2022-11-21 ENCOUNTER — Other Ambulatory Visit (HOSPITAL_COMMUNITY): Payer: Self-pay

## 2022-12-07 ENCOUNTER — Telehealth: Payer: Self-pay | Admitting: Pharmacist

## 2022-12-07 NOTE — Telephone Encounter (Signed)
Called patient to schedule an appointment for the [employer] [facility]. I was unable to reach the patient so I left a HIPAA-compliant message requesting that the patient return my call.   Luke Van Ausdall, PharmD, BCACP, CPP Clinical Pharmacist [facility] [phone]  

## 2022-12-15 ENCOUNTER — Other Ambulatory Visit (HOSPITAL_COMMUNITY): Payer: Self-pay

## 2022-12-16 ENCOUNTER — Other Ambulatory Visit (HOSPITAL_COMMUNITY): Payer: Self-pay

## 2022-12-26 ENCOUNTER — Ambulatory Visit
Admission: RE | Admit: 2022-12-26 | Discharge: 2022-12-26 | Disposition: A | Discharge status: Home / Self Care | Payer: 59 | Source: Ambulatory Visit | Attending: Family Medicine | Admitting: Family Medicine

## 2022-12-26 DIAGNOSIS — Z1231 Encounter for screening mammogram for malignant neoplasm of breast: Secondary | ICD-10-CM | POA: Diagnosis not present

## 2023-01-11 ENCOUNTER — Other Ambulatory Visit (HOSPITAL_COMMUNITY): Payer: Self-pay

## 2023-01-13 ENCOUNTER — Other Ambulatory Visit (HOSPITAL_COMMUNITY): Payer: Self-pay

## 2023-01-17 ENCOUNTER — Other Ambulatory Visit: Payer: Self-pay

## 2023-01-23 DIAGNOSIS — Z79899 Other long term (current) drug therapy: Secondary | ICD-10-CM | POA: Diagnosis not present

## 2023-01-23 DIAGNOSIS — M0589 Other rheumatoid arthritis with rheumatoid factor of multiple sites: Secondary | ICD-10-CM | POA: Diagnosis not present

## 2023-01-23 DIAGNOSIS — M8580 Other specified disorders of bone density and structure, unspecified site: Secondary | ICD-10-CM | POA: Diagnosis not present

## 2023-01-23 DIAGNOSIS — R945 Abnormal results of liver function studies: Secondary | ICD-10-CM | POA: Diagnosis not present

## 2023-02-08 ENCOUNTER — Telehealth: Payer: Self-pay | Admitting: Pharmacist

## 2023-02-08 ENCOUNTER — Other Ambulatory Visit (HOSPITAL_COMMUNITY): Payer: Self-pay

## 2023-02-08 MED ORDER — ENBREL SURECLICK 50 MG/ML ~~LOC~~ SOAJ: 50.0000 mg | SUBCUTANEOUS | 2 refills | Status: DC

## 2023-02-08 NOTE — Telephone Encounter (Signed)
Called patient to schedule an appointment for the [employer] [facility]. I was unable to reach the patient so I left a HIPAA-compliant message requesting that the patient return my call.   Luke Van Ausdall, PharmD, BCACP, CPP Clinical Pharmacist [facility] [phone]  

## 2023-02-09 ENCOUNTER — Other Ambulatory Visit: Payer: Self-pay

## 2023-02-09 ENCOUNTER — Ambulatory Visit: Payer: 59 | Attending: Family Medicine | Admitting: Pharmacist

## 2023-02-09 ENCOUNTER — Other Ambulatory Visit (HOSPITAL_COMMUNITY): Payer: Self-pay

## 2023-02-09 DIAGNOSIS — Z79899 Other long term (current) drug therapy: Secondary | ICD-10-CM

## 2023-02-09 MED ORDER — ENBREL SURECLICK 50 MG/ML ~~LOC~~ SOAJ
50.0000 mg | SUBCUTANEOUS | 2 refills | Status: DC
  Filled 2023-02-09: qty 4, 28d supply, fill #0
  Filled 2023-03-08: qty 4, 28d supply, fill #1
  Filled 2023-04-07: qty 4, 28d supply, fill #2
  Filled 2023-05-03: qty 4, 28d supply, fill #3
  Filled 2023-05-30: qty 4, 28d supply, fill #4
  Filled 2023-06-22: qty 4, 28d supply, fill #5
  Filled 2023-07-19: qty 4, 28d supply, fill #6
  Filled 2023-08-21: qty 4, 28d supply, fill #7
  Filled 2023-09-27: qty 4, 28d supply, fill #8

## 2023-02-09 NOTE — Progress Notes (Signed)
  S: Patient presents for review of their specialty medication therapy.  Patient is currently taking Enbrel for RA. Patient is managed by Dr. Kathi Ludwig for this.  Dosing: RA - SubQ: Note: May continue methotrexate, glucocorticoids, salicylates, NSAIDs, or analgesics during etanercept therapy. Once-weekly dosing: 50 mg once weekly  Monitoring: Injection site reactions: reports occasional swelling/redness; resolves quickly and rheumatologist is aware S/sx of infections: denies  S/sx of malignancy: denies GI upset: denies   O: Lab Results  Component Value Date   WBC 6.3 10/21/2022   HGB 16.0 (H) 10/21/2022   HCT 46.0 10/21/2022   MCV 89.4 10/21/2022   PLT 253.0 10/21/2022      Chemistry      Component Value Date/Time   NA 140 10/21/2022 0752   K 4.0 10/21/2022 0752   CL 106 10/21/2022 0752   CO2 26 10/21/2022 0752   BUN 11 10/21/2022 0752   CREATININE 1.00 10/21/2022 0752   CREATININE 0.92 08/22/2012 2053      Component Value Date/Time   CALCIUM 9.5 10/21/2022 0752   ALKPHOS 58 10/21/2022 0752   AST 52 (H) 10/21/2022 0752   ALT 55 (H) 10/21/2022 0752   BILITOT 0.8 10/21/2022 0752     A/P: 1. Medication review: patient currently on Enbrel for RA and is tolerating it. Reviewed the medication with the patient, including the following: Enbrel (etanercept) binds tumor necrosis factor (TNF) and blocks its interaction with cell surface receptors. TNF plays an important role in the inflammatory processes of many diseases. Patient educated on purpose, proper use and potential adverse effects of Enbrel. Adverse effects include rash, GI upset, increased risk of infection, and injection site reactions. Patients should stop Enbrel if they develop a serious infection. There is a possible increased risk in lymphoma and other malignancies. No recommendations for any changes.   Butch Penny, PharmD, P[place]y [place], CPP Clinical Pharmacist [facility] [phone]

## 2023-02-25 ENCOUNTER — Encounter (HOSPITAL_COMMUNITY): Payer: Self-pay

## 2023-03-06 ENCOUNTER — Other Ambulatory Visit (HOSPITAL_COMMUNITY): Payer: Self-pay

## 2023-03-08 ENCOUNTER — Other Ambulatory Visit: Payer: Self-pay

## 2023-03-08 NOTE — Progress Notes (Signed)
Specialty Pharmacy Refill Coordination Note  Lisa Chase is a 52 y.o. female contacted today regarding refills of specialty medication(s) Etanercept   Patient requested Delivery   Delivery date: 03/15/23   Verified address: [address] [name] [address]   Medication will be filled on 03/14/23.

## 2023-03-08 NOTE — Progress Notes (Signed)
Specialty Pharmacy Ongoing Clinical Assessment Note  Lisa Chase is a 52 y.o. female who is being followed by the specialty pharmacy service for RxSp Rheumatoid Arthritis   Patient's specialty medication(s) reviewed today: Etanercept   Missed doses in the last 4 weeks: 0   Patient did not have any additional questions or concerns.   Therapeutic benefit summary: Patient is achieving benefit   Adverse events/side effects summary: No adverse events/side effects   Patient's therapy is appropriate to: Continue    Goals Addressed             This Visit's Progress    Improve or maintain quality of life       Patient is on track. Patient will maintain adherence         Follow up:  6 months

## 2023-04-07 ENCOUNTER — Other Ambulatory Visit: Payer: Self-pay

## 2023-04-07 NOTE — Progress Notes (Signed)
Specialty Pharmacy Refill Coordination Note  Lisa Chase is a 52 y.o. female contacted today regarding refills of specialty medication(s) Etanercept   Patient requested Delivery   Delivery date: 04/13/23   Verified address: [address] [name] [address]   Medication will be filled on 04/12/23.

## 2023-04-12 ENCOUNTER — Other Ambulatory Visit: Payer: Self-pay

## 2023-04-13 ENCOUNTER — Other Ambulatory Visit: Payer: Self-pay

## 2023-04-18 ENCOUNTER — Ambulatory Visit (INDEPENDENT_AMBULATORY_CARE_PROVIDER_SITE_OTHER): Payer: 59 | Admitting: Family Medicine

## 2023-04-18 VITALS — BP 120/82 | HR 81 | Temp 99.3°F | Wt 180.0 lb

## 2023-04-18 DIAGNOSIS — H1032 Unspecified acute conjunctivitis, left eye: Secondary | ICD-10-CM | POA: Diagnosis not present

## 2023-04-18 DIAGNOSIS — R059 Cough, unspecified: Secondary | ICD-10-CM

## 2023-04-18 DIAGNOSIS — J019 Acute sinusitis, unspecified: Secondary | ICD-10-CM | POA: Diagnosis not present

## 2023-04-18 LAB — POC COVID19 BINAXNOW: SARS Coronavirus 2 Ag: NEGATIVE

## 2023-04-18 LAB — POCT INFLUENZA A/B
Influenza A, POC: NEGATIVE
Influenza B, POC: NEGATIVE

## 2023-04-18 MED ORDER — HYDROCODONE BIT-HOMATROP MBR 5-1.5 MG/5ML PO SOLN: 5.0000 mL | ORAL | 0 refills | Status: DC | PRN

## 2023-04-18 MED ORDER — AZITHROMYCIN 250 MG PO TABS: ORAL_TABLET | ORAL | 0 refills | Status: DC

## 2023-04-18 MED ORDER — TOBRAMYCIN-DEXAMETHASONE 0.3-0.1 % OP SUSP: 2.0000 [drp] | OPHTHALMIC | 0 refills | Status: AC

## 2023-04-18 NOTE — Progress Notes (Signed)
   Subjective:    Patient ID: Lisa Chase, female    DOB: 1970-12-25, 52 y.o.   MRN: [id number]  HPI Here for one week of sinus congestion, PND, ST, a dry cough, and left ear pain. Now this morning she woke up with her left eye burning. The eye is red and her lids were stuck together with mucus. She is taking Delsym with no relief. Drinking fluids    Review of Systems  Constitutional: Negative.   HENT:  Positive for congestion, ear pain, postnasal drip, sinus pressure and sore throat.   Eyes:  Positive for discharge and redness.  Respiratory:  Positive for cough. Negative for shortness of breath and wheezing.        Objective:   Physical Exam Constitutional:      General: She is not in acute distress. HENT:     Right Ear: Tympanic membrane, ear canal and external ear normal.     Left Ear: Tympanic membrane, ear canal and external ear normal.     Nose: Nose normal.     Mouth/Throat:     Pharynx: Oropharynx is clear.  Eyes:     Pupils: Pupils are equal, round, and reactive to light.     Comments: Left conjunctiva is red, the right is clear   Pulmonary:     Effort: Pulmonary effort is normal.     Breath sounds: Normal breath sounds.  Lymphadenopathy:     Cervical: No cervical adenopathy.  Neurological:     Mental Status: She is alert.           Assessment & Plan:  Sinusitis with conjunctivitis. Treat with a Zpack and Tobradex eye drops.  Gershon Crane, MD

## 2023-04-25 ENCOUNTER — Other Ambulatory Visit (HOSPITAL_COMMUNITY): Payer: Self-pay

## 2023-04-25 ENCOUNTER — Telehealth: Payer: Self-pay | Admitting: Family Medicine

## 2023-04-25 NOTE — Progress Notes (Signed)
Pharmacy Patient Advocate Encounter   Received notification from CoverMyMeds that prior authorization for Enbrel is required/requested. Current PA expires on 11/30   Insurance verification completed.   The patient is insured through [facility] .   Per test claim: PA required; PA submitted to above mentioned insurance via CoverMyMeds Key/confirmation #/EOC [facility] Status is pending

## 2023-04-25 NOTE — Telephone Encounter (Signed)
Pt saw Clent Ridges on 04/18/23 and was treated for the cough but it still persists. Asking for albuterol for nebulizer treatment which she feels will assist. She has been using hers at home and it is giving relief

## 2023-04-26 ENCOUNTER — Ambulatory Visit: Payer: 59 | Admitting: Family Medicine

## 2023-04-26 ENCOUNTER — Encounter: Payer: Self-pay | Admitting: Family Medicine

## 2023-04-26 VITALS — BP 140/96 | HR 58 | Temp 98.3°F | Wt 178.0 lb

## 2023-04-26 DIAGNOSIS — J40 Bronchitis, not specified as acute or chronic: Secondary | ICD-10-CM | POA: Diagnosis not present

## 2023-04-26 DIAGNOSIS — J209 Acute bronchitis, unspecified: Secondary | ICD-10-CM

## 2023-04-26 MED ORDER — DOXYCYCLINE HYCLATE 100 MG PO TABS: 100.0000 mg | ORAL_TABLET | Freq: Two times a day (BID) | ORAL | 0 refills | Status: DC

## 2023-04-26 MED ORDER — ALBUTEROL SULFATE (2.5 MG/3ML) 0.083% IN NEBU: 2.5000 mg | INHALATION_SOLUTION | RESPIRATORY_TRACT | 1 refills | Status: DC | PRN

## 2023-04-26 NOTE — Progress Notes (Signed)
   Subjective:    Patient ID: Lisa Chase, female    DOB: 1970-09-05, 52 y.o.   MRN: [id number]  HPI Here for continued coughing that started 3 weeks ago. We saw her on 04-18-23 for a sinus infection and for conjunctivitis. We treated her with a Zpack and Tobradex drops. The conjunctivitis resolved quickly, and her sinus congestion improved. However she has continued to have a hard dry cough. She has begun to wheeze at times, and she gets mildly SOB. No fever. She uses Hycodan syrup at night and this helps her sleep. She cannot use this during the day because it makes her drowsy. She has a nebulizer at home, but she has no solution to put in it. She has never been diagnosed with asthma, but she says she gets something like this twice a year when the seasons change.   Review of Systems  Constitutional: Negative.   HENT: Negative.    Eyes: Negative.   Respiratory:  Positive for cough, shortness of breath and wheezing.   Cardiovascular: Negative.        Objective:   Physical Exam Constitutional:      Appearance: Normal appearance. She is not ill-appearing.  HENT:     Right Ear: Tympanic membrane, ear canal and external ear normal.     Left Ear: Tympanic membrane, ear canal and external ear normal.     Nose: Nose normal.     Mouth/Throat:     Pharynx: Oropharynx is clear.  Eyes:     Conjunctiva/sclera: Conjunctivae normal.  Pulmonary:     Effort: Pulmonary effort is normal.     Breath sounds: Normal breath sounds.  Lymphadenopathy:     Cervical: No cervical adenopathy.  Neurological:     Mental Status: She is alert.           Assessment & Plan:  She now has a bronchitis with some bronchospasm. We w ill treat this with 10 days of Doxycycline. We will give her some albuterol solution to use in her nebulizer. She has a supply of 5 mg Prednisone at home that her rheumatologist gave her. She will take a 9 day taper of this at 15 mg then 10 mg then 5 mg a day. Follow  up as needed.  Gershon Crane, MD

## 2023-04-26 NOTE — Telephone Encounter (Signed)
Done at pt visit today

## 2023-04-26 NOTE — Telephone Encounter (Signed)
Please refill the albuterol vials for her nebulizer

## 2023-04-28 NOTE — Progress Notes (Signed)
Pharmacy Patient Advocate Encounter  Received notification from [facility] that Prior Authorization for Enbrel has been APPROVED from 05/07/23 to 05/05/24   PA #/Case ID/Reference #:  [id number]

## 2023-05-03 ENCOUNTER — Other Ambulatory Visit: Payer: Self-pay

## 2023-05-03 ENCOUNTER — Other Ambulatory Visit (HOSPITAL_COMMUNITY): Payer: Self-pay

## 2023-05-03 NOTE — Progress Notes (Signed)
Specialty Pharmacy Refill Coordination Note  Lisa Chase [name] is a 52 y.o. female contacted today regarding refills of specialty medication(s) Etanercept   Patient requested Delivery   Delivery date: 05/12/23   Verified address: [address] [name] [id number]   Medication will be filled on 05/11/23.

## 2023-05-12 ENCOUNTER — Other Ambulatory Visit: Payer: Self-pay

## 2023-05-30 ENCOUNTER — Other Ambulatory Visit (HOSPITAL_COMMUNITY): Payer: Self-pay

## 2023-05-30 ENCOUNTER — Other Ambulatory Visit (HOSPITAL_COMMUNITY): Payer: Self-pay | Admitting: Pharmacy Technician

## 2023-05-30 NOTE — Progress Notes (Signed)
      Specialty Pharmacy Refill Coordination Note  Lisa Chase Lisa Chase is a 52 y.o. female contacted today regarding refills of specialty medication(s) Etanercept (Enbrel SureClick)   Patient requested Delivery   Delivery date: 06/06/23   Verified address: [address]  [place]   Medication will be filled on 06/05/23.

## 2023-06-05 ENCOUNTER — Other Ambulatory Visit: Payer: Self-pay

## 2023-06-22 ENCOUNTER — Other Ambulatory Visit: Payer: Self-pay

## 2023-06-22 NOTE — Progress Notes (Signed)
   Specialty Pharmacy Refill Coordination Note  Lisa Chase [name] is a 53 y.o. female contacted today regarding refills of specialty medication(s) Etanercept (Enbrel SureClick)   Patient requested Delivery   Delivery date: 06/29/23   Verified address: [address]   [place] [address]   Medication will be filled on 06/28/23.

## 2023-06-28 ENCOUNTER — Other Ambulatory Visit: Payer: Self-pay

## 2023-07-03 ENCOUNTER — Ambulatory Visit (INDEPENDENT_AMBULATORY_CARE_PROVIDER_SITE_OTHER): Payer: 59

## 2023-07-03 ENCOUNTER — Ambulatory Visit: Payer: 59 | Admitting: Family Medicine

## 2023-07-03 ENCOUNTER — Other Ambulatory Visit (HOSPITAL_BASED_OUTPATIENT_CLINIC_OR_DEPARTMENT_OTHER): Payer: Self-pay

## 2023-07-03 ENCOUNTER — Encounter: Payer: Self-pay | Admitting: Family Medicine

## 2023-07-03 VITALS — BP 120/70 | HR 85 | Temp 98.4°F | Ht 63.5 in | Wt 176.2 lb

## 2023-07-03 DIAGNOSIS — R059 Cough, unspecified: Secondary | ICD-10-CM | POA: Diagnosis not present

## 2023-07-03 DIAGNOSIS — J324 Chronic pansinusitis: Secondary | ICD-10-CM | POA: Diagnosis not present

## 2023-07-03 DIAGNOSIS — J40 Bronchitis, not specified as acute or chronic: Secondary | ICD-10-CM

## 2023-07-03 DIAGNOSIS — Z7969 Long term (current) use of other immunomodulators and immunosuppressants: Secondary | ICD-10-CM

## 2023-07-03 MED ORDER — BENZONATATE 200 MG PO CAPS
200.0000 mg | ORAL_CAPSULE | Freq: Two times a day (BID) | ORAL | 0 refills | Status: DC | PRN
  Filled 2023-07-03: qty 20, 10d supply, fill #0

## 2023-07-03 MED ORDER — ALBUTEROL SULFATE HFA 108 (90 BASE) MCG/ACT IN AERS
2.0000 | INHALATION_SPRAY | Freq: Four times a day (QID) | RESPIRATORY_TRACT | 0 refills | Status: DC | PRN
  Filled 2023-07-03: qty 8.5, 28d supply, fill #0

## 2023-07-03 MED ORDER — AMOXICILLIN-POT CLAVULANATE 500-125 MG PO TABS
1.0000 | ORAL_TABLET | Freq: Two times a day (BID) | ORAL | 0 refills | Status: AC
  Filled 2023-07-03: qty 14, 7d supply, fill #0

## 2023-07-03 NOTE — Progress Notes (Signed)
Established Patient Office Visit   Subjective  Patient ID: Lisa Chase, female    DOB: Jul 03, 1970  Age: 53 y.o. MRN: [id number]  Chief Complaint  Patient presents with   Cough    Started a month ago, when patient was sick, patient just has a cough when talking     Patient is a 53 year old female seen for ongoing concern.  Patient endorses continued dry cough x 2 months.  Patient seen in clinic on 11/12 and 04/26/2023 for symptoms.  Given Rx for azithromycin then doxycycline and albuterol nebulizer solution.  Albuterol neb solution helps some.   Also advised to start a short prednisone taper.  Patient also notes sinus drainage, sore throat, achy/muffled ears, pressure in face.  Cough worse with increased talking.  Causing soreness in chest.  Tried Hycodan cough syrup at night as causes drowsiness during the day.  Using cough drops.  Does not like taking prednisone as it causes increased appetite.  On Enbrel for RA.    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 No pertinent surgical history. Social History   Tobacco Use   Smoking status: Never   Smokeless tobacco: Never  Substance Use Topics   Alcohol use: No   Drug use: No   Family History  Problem Relation Age of Onset   Heart disease Mother    No Known Allergies    ROS Negative unless stated above    Objective:     BP 120/70 (BP Location: Left Arm, Patient Position: Sitting, Cuff Size: Normal)   Pulse 85   Temp 98.4 F (36.9 C) (Oral)   Ht 5' 3.5" (1.613 m)   Wt 176 lb 3.2 oz (79.9 kg)   LMP  (LMP Unknown) Comment: patient states it was 6 months ago  SpO2 97%    BMI 30.72 kg/m  BP Readings from Last 3 Encounters:  07/03/23 120/70  04/26/23 (!) 140/96  04/18/23 120/82   Wt Readings from Last 3 Encounters:  07/03/23 176 lb 3.2 oz (79.9 kg)  04/26/23 178 lb (80.7 kg)  04/18/23 180 lb (81.6 kg)     Physical Exam Constitutional:      General: She is not in acute distress.    Appearance: Normal appearance.  HENT:     Head: Normocephalic and atraumatic.     Right Ear: Ear canal and external ear normal.     Left Ear: External ear normal.     Ears:     Comments: Left canal with small amount of cerumen on TM.  Bilateral TMs full without erythema, effusion, or suppurative fluid.    Nose: Nose normal.     Mouth/Throat:     Mouth: Mucous membranes are moist.  Cardiovascular:     Rate and Rhythm: Normal rate and regular rhythm.     Heart sounds: Normal heart sounds. No murmur heard.    No gallop.  Pulmonary:     Effort: Pulmonary effort is normal. No respiratory distress.     Breath sounds: Normal breath sounds. No decreased air movement or transmitted upper airway sounds. No decreased breath sounds, wheezing, rhonchi or rales.  Skin:    General:  Skin is warm and dry.  Neurological:     Mental Status: She is alert and oriented to person, place, and time.     No results found for any visits on 07/03/23.    Assessment & Plan:  Bronchitis -     DG Chest 2 View -     Benzonatate; Take 1 capsule (200 mg total) by mouth 2 (two) times daily as needed for cough.  Dispense: 20 capsule; Refill: 0 -     Albuterol Sulfate HFA; Inhale 2 puffs into the lungs every 6 (six) hours as needed for wheezing or shortness of breath.  Dispense: 8.5 g; Refill: 0  Pansinusitis, unspecified chronicity -     Amoxicillin-Pot Clavulanate; Take 1 tablet by mouth in the morning and at bedtime for 7 days.  Dispense: 14 tablet; Refill: 0  Long term (current) use of other immunomodulators and immunosuppressants   Patient with cough x 2 months likely due to bronchitis.   Given duration of symptoms and use of immunosuppressants obtain CXR.  Tessalon as needed for cough during the day.  Albuterol inhaler if needed during the day.  Start Augmentin for pansinusitis.  Okay to use Flonase or saline nasal limits.  Further recommendations based on results.  For continued or worsening symptoms referral to pulmonology.  Return if symptoms worsen or fail to improve.   Deeann Saint, MD

## 2023-07-14 ENCOUNTER — Encounter: Payer: Self-pay | Admitting: Family Medicine

## 2023-07-14 ENCOUNTER — Ambulatory Visit: Payer: Self-pay | Admitting: Family Medicine

## 2023-07-14 ENCOUNTER — Other Ambulatory Visit (HOSPITAL_BASED_OUTPATIENT_CLINIC_OR_DEPARTMENT_OTHER): Payer: Self-pay

## 2023-07-14 ENCOUNTER — Ambulatory Visit: Payer: 59 | Admitting: Family Medicine

## 2023-07-14 VITALS — BP 110/80 | HR 77 | Temp 98.1°F | Ht 63.5 in | Wt 175.2 lb

## 2023-07-14 DIAGNOSIS — J0191 Acute recurrent sinusitis, unspecified: Secondary | ICD-10-CM

## 2023-07-14 LAB — POC COVID19 BINAXNOW: SARS Coronavirus 2 Ag: NEGATIVE

## 2023-07-14 LAB — POCT INFLUENZA A/B
Influenza A, POC: NEGATIVE
Influenza B, POC: NEGATIVE

## 2023-07-14 MED ORDER — CEFUROXIME AXETIL 500 MG PO TABS: 500.0000 mg | ORAL_TABLET | Freq: Two times a day (BID) | ORAL | 0 refills | Status: AC

## 2023-07-14 MED ORDER — METHYLPREDNISOLONE 4 MG PO TBPK
ORAL_TABLET | ORAL | 0 refills | Status: DC
  Filled 2023-07-14: qty 21, 6d supply, fill #0

## 2023-07-14 MED ORDER — HYDROCODONE BIT-HOMATROP MBR 5-1.5 MG/5ML PO SOLN
5.0000 mL | ORAL | 0 refills | Status: DC | PRN
  Filled 2023-07-14: qty 240, 8d supply, fill #0

## 2023-07-14 NOTE — Telephone Encounter (Signed)
  Chief Complaint: cough with night sweats Symptoms: cough, night sweats/fever at noc Frequency: 3 days for the night sweats, weeks for the cough Pertinent Negatives: Patient denies hemoptysis, SOB, exposure to TB, travel to foreign countries, long trips via plane/car.   Disposition: [] ED /[] Urgent Care (no appt availability in office) / [x] Appointment(In office/virtual)/ []  [place] [facility]/ [] Home Care/ [] Refused Recommended Disposition /[] [place] [facility]/ []  Follow-up with PCP  Additional Notes: Pt was seen in Jan and was given numerous medications to try. Pt states that she has been suffering from fever for 3 days but only at noc. Pt states the temp is over 100. Denies coughing up blood. Denies travel. Denies exposure to TB. States dry cough. Pt has a hx of RA. Has been dealing with the cough for approx 2 months. Seen by numerous providers. Sched today with another provider in her PCP's office.   Copied from CRM [id number]. Topic: Clinical - Red Word Triage >> Jul 14, 2023  9:10 AM Lisa Chase wrote: Red Word that prompted transfer to Nurse Triage: Fever, persistent cough Reason for Disposition . Cough has been present for > 3 weeks  Answer Assessment - Initial Assessment Questions 1. ONSET: When did the cough begin?      About 2-3 weeks 2. SEVERITY: How bad is the cough today?      Mild-moderate 3. SPUTUM: Describe the color of your sputum (none, dry cough; clear, white, yellow, green)     None, dry cough,  4. HEMOPTYSIS: Are you coughing up any blood? If so ask: How much? (flecks, streaks, tablespoons, etc.)     denies 5. DIFFICULTY BREATHING: Are you having difficulty breathing? If Yes, ask: How bad is it? (e.g., mild, moderate, severe)    - MILD: No SOB at rest, mild SOB with walking, speaks normally in sentences, can lie down, no retractions, pulse < 100.    - MODERATE: SOB at rest, SOB with minimal exertion and prefers to sit, cannot lie down flat, speaks in  phrases, mild retractions, audible wheezing, pulse 100-120.    - SEVERE: Very SOB at rest, speaks in single words, struggling to breathe, sitting hunched forward, retractions, pulse > 120      denies 6. FEVER: Do you have a fever? If Yes, ask: What is your temperature, how was it measured, and when did it start?     Only at noc 7. CARDIAC HISTORY: Do you have any history of heart disease? (e.g., heart attack, congestive heart failure)      denies 8. LUNG HISTORY: Do you have any history of lung disease?  (e.g., pulmonary embolus, asthma, emphysema)     denies 9. PE RISK FACTORS: Do you have a history of blood clots? (or: recent major surgery, recent prolonged travel, bedridden)     denies 10. OTHER SYMPTOMS: Do you have any other symptoms? (e.g., runny nose, wheezing, chest pain)       Wheezing, runny nose initially 12. TRAVEL: Have you traveled out of the country in the last month? (e.g., travel history, exposures)       denies  Protocols used: Cough - Acute Non-Productive-A-AH

## 2023-07-14 NOTE — Progress Notes (Signed)
   Subjective:    Patient ID: Lisa Chase, female    DOB: 06-30-1970, 53 y.o.   MRN: [id number]  HPI Here for recurrent URI symptoms, primarily coughing. She developed a cough 2 months ago and she was treated with Azithromycin  and Doxycycline . These did not seem to help. Then she was seen on 07-03-23 and was given Augmentin . This seemed to help at first, but then the coughing returned. Today she has sinus congestion, PND, and a dry cough. No fever or SOB. Benzonatate  does not help with the cough.    Review of Systems  Constitutional: Negative.   HENT:  Positive for congestion, postnasal drip and sinus pressure. Negative for ear pain and sore throat.   Eyes: Negative.   Respiratory:  Positive for cough. Negative for shortness of breath and wheezing.        Objective:   Physical Exam Constitutional:      Appearance: Normal appearance.  HENT:     Right Ear: Tympanic membrane, ear canal and external ear normal.     Left Ear: Tympanic membrane, ear canal and external ear normal.     Nose: Nose normal.     Mouth/Throat:     Pharynx: Oropharynx is clear.  Eyes:     Conjunctiva/sclera: Conjunctivae normal.  Pulmonary:     Effort: Pulmonary effort is normal.     Breath sounds: Normal breath sounds.  Lymphadenopathy:     Cervical: No cervical adenopathy.  Neurological:     Mental Status: She is alert.           Assessment & Plan:  Partially treated sinusitis. We will give her 10 days of Cefuroxime  and a Medrol  dose pack. Use Hycodan syrup as needed. Garnette Olmsted, MD

## 2023-07-19 ENCOUNTER — Other Ambulatory Visit (HOSPITAL_COMMUNITY): Payer: Self-pay

## 2023-07-19 ENCOUNTER — Other Ambulatory Visit: Payer: Self-pay

## 2023-07-19 NOTE — Progress Notes (Signed)
Specialty Pharmacy Refill Coordination Note  Lisa Chase Lisa Chase is a 53 y.o. female contacted today regarding refills of specialty medication(s) Etanercept (Enbrel SureClick)   Patient requested Delivery   Delivery date: 08/01/23   Verified address: [address] [place],  [place] [address]   Medication will be filled on 07/31/23.

## 2023-07-26 ENCOUNTER — Other Ambulatory Visit (HOSPITAL_BASED_OUTPATIENT_CLINIC_OR_DEPARTMENT_OTHER): Payer: Self-pay

## 2023-07-26 DIAGNOSIS — R7611 Nonspecific reaction to tuberculin skin test without active tuberculosis: Secondary | ICD-10-CM | POA: Diagnosis not present

## 2023-07-26 DIAGNOSIS — Z79899 Other long term (current) drug therapy: Secondary | ICD-10-CM | POA: Diagnosis not present

## 2023-07-26 DIAGNOSIS — Z13820 Encounter for screening for osteoporosis: Secondary | ICD-10-CM | POA: Diagnosis not present

## 2023-07-26 DIAGNOSIS — R945 Abnormal results of liver function studies: Secondary | ICD-10-CM | POA: Diagnosis not present

## 2023-07-26 DIAGNOSIS — M0589 Other rheumatoid arthritis with rheumatoid factor of multiple sites: Secondary | ICD-10-CM | POA: Diagnosis not present

## 2023-07-26 MED ORDER — HYDROXYCHLOROQUINE SULFATE 200 MG PO TABS
200.0000 mg | ORAL_TABLET | Freq: Every day | ORAL | 0 refills | Status: AC
  Filled 2023-07-26: qty 42, 30d supply, fill #0

## 2023-07-27 ENCOUNTER — Other Ambulatory Visit (HOSPITAL_BASED_OUTPATIENT_CLINIC_OR_DEPARTMENT_OTHER): Payer: Self-pay

## 2023-07-27 LAB — LAB REPORT - SCANNED
Calcium: 9.5
EGFR: 66

## 2023-08-21 ENCOUNTER — Other Ambulatory Visit: Payer: Self-pay

## 2023-08-21 NOTE — Progress Notes (Signed)
 Specialty Pharmacy Refill Coordination Note  Lisa Chase is a 53 y.o. female contacted today regarding refills of specialty medication(s) Etanercept (Enbrel SureClick)   Patient requested (Patient-Rptd) Delivery   Delivery date: 09/05/23  Verified address: (Patient-Rptd) [address]  [place] [address]   Medication will be filled on 09/04/23.

## 2023-08-30 DIAGNOSIS — R7611 Nonspecific reaction to tuberculin skin test without active tuberculosis: Secondary | ICD-10-CM | POA: Diagnosis not present

## 2023-08-30 DIAGNOSIS — Z79899 Other long term (current) drug therapy: Secondary | ICD-10-CM | POA: Diagnosis not present

## 2023-08-30 DIAGNOSIS — M0589 Other rheumatoid arthritis with rheumatoid factor of multiple sites: Secondary | ICD-10-CM | POA: Diagnosis not present

## 2023-08-30 DIAGNOSIS — R945 Abnormal results of liver function studies: Secondary | ICD-10-CM | POA: Diagnosis not present

## 2023-09-26 ENCOUNTER — Other Ambulatory Visit: Payer: Self-pay | Admitting: Family Medicine

## 2023-09-26 DIAGNOSIS — Z1231 Encounter for screening mammogram for malignant neoplasm of breast: Secondary | ICD-10-CM

## 2023-09-27 ENCOUNTER — Encounter (HOSPITAL_COMMUNITY): Payer: Self-pay

## 2023-09-27 ENCOUNTER — Other Ambulatory Visit: Payer: Self-pay

## 2023-09-27 NOTE — Progress Notes (Signed)
 Specialty Pharmacy Ongoing Clinical Assessment Note  Lisa Chase is a 53 y.o. female who is being followed by the specialty pharmacy service for RxSp Rheumatoid Arthritis   Patient's specialty medication(s) reviewed today: Etanercept  (Enbrel  SureClick)   Missed doses in the last 4 weeks: 0   Patient/Caregiver did not have any additional questions or concerns.   Therapeutic benefit summary: Patient is achieving benefit   Adverse events/side effects summary: No adverse events/side effects   Patient's therapy is appropriate to: Continue    Goals Addressed             This Visit's Progress    Improve or maintain quality of life   On track    Patient is on track. Patient will maintain adherence         Follow up:  6 months  Malcome Ambrocio M Aldean Suddeth Specialty Pharmacist

## 2023-09-27 NOTE — Progress Notes (Signed)
 Specialty Pharmacy Refill Coordination Note  Kadedra Vanaken Theta Fleck is a 53 y.o. female contacted today regarding refills of specialty medication(s) Etanercept  (Enbrel  SureClick)   Patient requested Delivery   Delivery date: 10/03/23   Verified address: [address]  [place]  [address]   Medication will be filled on 10/02/23.

## 2023-10-19 ENCOUNTER — Encounter: Admitting: Family Medicine

## 2023-10-25 ENCOUNTER — Other Ambulatory Visit: Payer: Self-pay

## 2023-10-25 ENCOUNTER — Other Ambulatory Visit: Payer: Self-pay | Admitting: Pharmacist

## 2023-10-25 MED ORDER — ENBREL SURECLICK 50 MG/ML ~~LOC~~ SOAJ
50.0000 mg | SUBCUTANEOUS | 2 refills | Status: AC
  Filled 2023-10-25: qty 4, 28d supply, fill #0
  Filled 2023-11-17: qty 4, 28d supply, fill #1
  Filled 2023-12-15: qty 4, 28d supply, fill #2
  Filled 2024-01-16: qty 4, 28d supply, fill #3
  Filled 2024-02-14: qty 4, 28d supply, fill #4
  Filled 2024-03-07: qty 4, 28d supply, fill #5
  Filled 2024-04-03: qty 4, 28d supply, fill #6
  Filled 2024-05-07: qty 4, 28d supply, fill #7
  Filled 2024-06-05: qty 4, 28d supply, fill #8

## 2023-10-25 MED ORDER — ENBREL SURECLICK 50 MG/ML ~~LOC~~ SOAJ: 50.0000 mg | SUBCUTANEOUS | 2 refills | Status: DC

## 2023-10-25 NOTE — Progress Notes (Signed)
 Specialty Pharmacy Refill Coordination Note  Lisa Chase is a 53 y.o. female contacted today regarding refills of specialty medication(s) Etanercept  (Enbrel  SureClick)   Patient requested (Patient-Rptd) Delivery   Delivery date: (Patient-Rptd) 11/02/23   Verified address: (Patient-Rptd) [address]  [place]  [place] [address]   Medication will be filled on 11/01/23, pending refill approval.   Sent to original prescriber. Send to [facility] for rewrite when refills are approved.

## 2023-11-17 ENCOUNTER — Other Ambulatory Visit: Payer: Self-pay

## 2023-11-17 ENCOUNTER — Encounter (INDEPENDENT_AMBULATORY_CARE_PROVIDER_SITE_OTHER): Payer: Self-pay

## 2023-11-17 NOTE — Progress Notes (Signed)
 Specialty Pharmacy Refill Coordination Note  Lisa Chase is a 53 y.o. female contacted today regarding refills of specialty medication(s) Etanercept  (Enbrel  SureClick)   Patient requested Delivery   Delivery date: 11/24/23   Verified address: [address] [name]   Medication will be filled on 11/23/23.

## 2023-11-23 ENCOUNTER — Other Ambulatory Visit: Payer: Self-pay

## 2023-12-12 ENCOUNTER — Other Ambulatory Visit: Payer: Self-pay

## 2023-12-15 ENCOUNTER — Encounter (INDEPENDENT_AMBULATORY_CARE_PROVIDER_SITE_OTHER): Payer: Self-pay

## 2023-12-15 ENCOUNTER — Other Ambulatory Visit: Payer: Self-pay

## 2023-12-15 ENCOUNTER — Other Ambulatory Visit: Payer: Self-pay | Admitting: Pharmacy Technician

## 2023-12-15 NOTE — Progress Notes (Signed)
 Specialty Pharmacy Refill Coordination Note  Lisa Chase is a 53 y.o. female contacted today regarding refills of specialty medication(s) Etanercept  (Enbrel  SureClick)   Patient requested (Patient-Rptd) Delivery   Delivery date: 12/26/23 Verified address: (Patient-Rptd) [address]  [facility]. [phone]   Medication will be filled on 12/25/23.

## 2023-12-25 ENCOUNTER — Other Ambulatory Visit: Payer: Self-pay

## 2023-12-26 ENCOUNTER — Other Ambulatory Visit (HOSPITAL_COMMUNITY): Payer: Self-pay

## 2023-12-26 ENCOUNTER — Other Ambulatory Visit (HOSPITAL_BASED_OUTPATIENT_CLINIC_OR_DEPARTMENT_OTHER): Payer: Self-pay

## 2023-12-26 DIAGNOSIS — M8580 Other specified disorders of bone density and structure, unspecified site: Secondary | ICD-10-CM | POA: Diagnosis not present

## 2023-12-26 DIAGNOSIS — Z79899 Other long term (current) drug therapy: Secondary | ICD-10-CM | POA: Diagnosis not present

## 2023-12-26 DIAGNOSIS — R7611 Nonspecific reaction to tuberculin skin test without active tuberculosis: Secondary | ICD-10-CM | POA: Diagnosis not present

## 2023-12-26 DIAGNOSIS — M0589 Other rheumatoid arthritis with rheumatoid factor of multiple sites: Secondary | ICD-10-CM | POA: Diagnosis not present

## 2023-12-26 MED ORDER — HYDROXYCHLOROQUINE SULFATE 200 MG PO TABS
400.0000 mg | ORAL_TABLET | Freq: Every day | ORAL | 1 refills | Status: AC
  Filled 2023-12-26: qty 180, 90d supply, fill #0
  Filled 2024-03-22: qty 180, 90d supply, fill #1

## 2023-12-26 MED ORDER — PREDNISONE 5 MG PO TABS
5.0000 mg | ORAL_TABLET | Freq: Every day | ORAL | 2 refills | Status: AC
  Filled 2023-12-26 (×2): qty 30, 30d supply, fill #0
  Filled 2024-01-22: qty 30, 30d supply, fill #1
  Filled 2024-03-22 – 2024-04-09 (×2): qty 30, 30d supply, fill #2

## 2023-12-28 ENCOUNTER — Ambulatory Visit (INDEPENDENT_AMBULATORY_CARE_PROVIDER_SITE_OTHER): Admitting: Family Medicine

## 2023-12-28 ENCOUNTER — Other Ambulatory Visit (HOSPITAL_BASED_OUTPATIENT_CLINIC_OR_DEPARTMENT_OTHER): Payer: Self-pay

## 2023-12-28 ENCOUNTER — Ambulatory Visit
Admission: RE | Admit: 2023-12-28 | Discharge: 2023-12-28 | Disposition: A | Discharge status: Home / Self Care | Source: Ambulatory Visit | Attending: Family Medicine

## 2023-12-28 ENCOUNTER — Encounter: Payer: Self-pay | Admitting: Family Medicine

## 2023-12-28 VITALS — BP 138/74 | HR 82 | Temp 97.6°F | Ht 63.5 in | Wt 177.6 lb

## 2023-12-28 DIAGNOSIS — R053 Chronic cough: Secondary | ICD-10-CM

## 2023-12-28 DIAGNOSIS — E781 Pure hyperglyceridemia: Secondary | ICD-10-CM | POA: Diagnosis not present

## 2023-12-28 DIAGNOSIS — Z1231 Encounter for screening mammogram for malignant neoplasm of breast: Secondary | ICD-10-CM

## 2023-12-28 DIAGNOSIS — E559 Vitamin D deficiency, unspecified: Secondary | ICD-10-CM

## 2023-12-28 DIAGNOSIS — M0579 Rheumatoid arthritis with rheumatoid factor of multiple sites without organ or systems involvement: Secondary | ICD-10-CM

## 2023-12-28 DIAGNOSIS — Z0000 Encounter for general adult medical examination without abnormal findings: Secondary | ICD-10-CM

## 2023-12-28 DIAGNOSIS — Z1211 Encounter for screening for malignant neoplasm of colon: Secondary | ICD-10-CM

## 2023-12-28 DIAGNOSIS — D170 Benign lipomatous neoplasm of skin and subcutaneous tissue of head, face and neck: Secondary | ICD-10-CM

## 2023-12-28 DIAGNOSIS — R7989 Other specified abnormal findings of blood chemistry: Secondary | ICD-10-CM

## 2023-12-28 LAB — CBC WITH DIFFERENTIAL/PLATELET
Basophils Absolute: 0.1 K/uL (ref 0.0–0.1)
Basophils Relative: 0.8 % (ref 0.0–3.0)
Eosinophils Absolute: 0.1 K/uL (ref 0.0–0.7)
Eosinophils Relative: 1.5 % (ref 0.0–5.0)
HCT: 46.4 % — ABNORMAL HIGH (ref 36.0–46.0)
Hemoglobin: 15.8 g/dL — ABNORMAL HIGH (ref 12.0–15.0)
Lymphocytes Relative: 52.7 % — ABNORMAL HIGH (ref 12.0–46.0)
Lymphs Abs: 3.7 K/uL (ref 0.7–4.0)
MCHC: 34 g/dL (ref 30.0–36.0)
MCV: 89.3 fl (ref 78.0–100.0)
Monocytes Absolute: 0.8 K/uL (ref 0.1–1.0)
Monocytes Relative: 11.5 % (ref 3.0–12.0)
Neutro Abs: 2.3 K/uL (ref 1.4–7.7)
Neutrophils Relative %: 33.5 % — ABNORMAL LOW (ref 43.0–77.0)
Platelets: 233 K/uL (ref 150.0–400.0)
RBC: 5.2 Mil/uL — ABNORMAL HIGH (ref 3.87–5.11)
RDW: 13.7 % (ref 11.5–15.5)
WBC: 7 K/uL (ref 4.0–10.5)

## 2023-12-28 LAB — COMPREHENSIVE METABOLIC PANEL WITH GFR
ALT: 71 U/L — ABNORMAL HIGH (ref 0–35)
AST: 65 U/L — ABNORMAL HIGH (ref 0–37)
Albumin: 4.5 g/dL (ref 3.5–5.2)
Alkaline Phosphatase: 61 U/L (ref 39–117)
BUN: 12 mg/dL (ref 6–23)
CO2: 26 meq/L (ref 19–32)
Calcium: 9.3 mg/dL (ref 8.4–10.5)
Chloride: 104 meq/L (ref 96–112)
Creatinine, Ser: 0.96 mg/dL (ref 0.40–1.20)
GFR: 67.59 mL/min (ref >60.00)
Glucose, Bld: 97 mg/dL (ref 70–99)
Potassium: 3.5 meq/L (ref 3.5–5.1)
Sodium: 139 meq/L (ref 135–145)
Total Bilirubin: 0.6 mg/dL (ref 0.2–1.2)
Total Protein: 8 g/dL (ref 6.0–8.3)

## 2023-12-28 LAB — LIPID PANEL
Cholesterol: 118 mg/dL (ref 0–200)
HDL: 32.6 mg/dL — ABNORMAL LOW (ref >39.00)
LDL Cholesterol: 60 mg/dL (ref 0–99)
NonHDL: 85.47
Total CHOL/HDL Ratio: 4
Triglycerides: 125 mg/dL (ref 0.0–149.0)
VLDL: 25 mg/dL (ref 0.0–40.0)

## 2023-12-28 LAB — C-REACTIVE PROTEIN: CRP: <1 mg/dL (ref 0.5–20.0)

## 2023-12-28 LAB — VITAMIN D 25 HYDROXY (VIT D DEFICIENCY, FRACTURES): VITD: 53.25 ng/mL (ref 30.00–100.00)

## 2023-12-28 LAB — SEDIMENTATION RATE: Sed Rate: 26 mm/h (ref 0–30)

## 2023-12-28 LAB — TSH: TSH: 2.82 u[IU]/mL (ref 0.35–5.50)

## 2023-12-28 MED ORDER — ALBUTEROL SULFATE HFA 108 (90 BASE) MCG/ACT IN AERS
2.0000 | INHALATION_SPRAY | Freq: Four times a day (QID) | RESPIRATORY_TRACT | 1 refills | Status: DC | PRN
  Filled 2023-12-28: qty 6.7, 23d supply, fill #0
  Filled 2024-01-22: qty 6.7, 23d supply, fill #1

## 2023-12-28 NOTE — Progress Notes (Signed)
 Established Patient Office Visit   Subjective  Patient ID: Lisa Chase, female    DOB: Apr 05, 1971  Age: 53 y.o. MRN: [id number]  Chief Complaint  Patient presents with   Annual Exam    Cough that started 1 /2 months ago     Pt is a 53 yo female seen for CPE and ongoing concern.  Patient with a dry off-and-on x 1 and half months.  Unable to associate any pain with cough.  May have some improvement when taking allergy medication.  Denies heartburn.  Patient requesting additional labs to be drawn at the request of rheumatology.  On Enbrel  for RA.  Hot humid weather making arthritis worse.    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 No pertinent surgical history. Social History   Tobacco Use   Smoking status: Never   Smokeless tobacco: Never  Substance Use Topics   Alcohol use: No   Drug use: No   Family History  Problem Relation Age of Onset   Heart disease Mother    BRCA 1/2 Neg Hx    Breast cancer Neg Hx    No Known Allergies  ROS Negative unless stated above    Objective:     BP 138/74 (BP Location: Left Arm, Patient Position: Sitting, Cuff Size: Normal)   Pulse 82   Temp 97.6 F (36.4 C) (Oral)   Ht 5' 3.5 (1.613 m)   Wt 177 lb 9.6 oz (80.6 kg)   LMP  (LMP Unknown)   SpO2 94%   BMI 30.97 kg/m  BP Readings from Last 3 Encounters:  12/28/23 138/74  07/14/23 110/80  07/03/23 120/70   Wt Readings from Last 3 Encounters:  12/28/23 177 lb 9.6 oz (80.6 kg)  07/14/23 175 lb 3.2 oz (79.5 kg)  07/03/23 176 lb 3.2 oz (79.9 kg)      Physical Exam Constitutional:      Appearance: Normal appearance.  HENT:      Head: Normocephalic and atraumatic.     Right Ear: Tympanic membrane, ear canal and external ear normal.     Left Ear: Tympanic membrane, ear canal and external ear normal.     Nose: Nose normal.     Mouth/Throat:     Mouth: Mucous membranes are moist.     Pharynx: No oropharyngeal exudate or posterior oropharyngeal erythema.  Eyes:     General: No scleral icterus.    Extraocular Movements: Extraocular movements intact.     Conjunctiva/sclera: Conjunctivae normal.     Pupils: Pupils are equal, round, and reactive to light.  Neck:     Thyroid : No thyromegaly.  Cardiovascular:     Rate and Rhythm: Normal rate and regular rhythm.     Pulses: Normal pulses.     Heart sounds: Normal heart sounds. No murmur heard.    No friction rub.  Pulmonary:     Effort: Pulmonary effort is normal.     Breath sounds: Normal breath sounds. No wheezing, rhonchi or rales.     Comments: Dry cough/throat clearing. Abdominal:     General: Bowel sounds are normal.     Palpations: Abdomen is soft.  Tenderness: There is no abdominal tenderness.  Musculoskeletal:        General: Normal range of motion.     Right hand: Deformity present.     Left hand: Deformity present.  Lymphadenopathy:     Cervical: No cervical adenopathy.  Skin:    General: Skin is warm and dry.     Findings: No lesion.     Comments: A 2-2.5 cm squishy lipomatous like mass at base of left neck/medial clavicle.  Neurological:     General: No focal deficit present.     Mental Status: She is alert and oriented to person, place, and time.  Psychiatric:        Mood and Affect: Mood normal.        Thought Content: Thought content normal.        07/14/2023    1:40 PM 07/03/2023   11:45 AM 10/17/2022   11:02 AM  Depression screen PHQ 2/9  Decreased Interest 1 1 0  Down, Depressed, Hopeless 0 0 0  PHQ - 2 Score 1 1 0  Altered sleeping 1 2   Tired, decreased energy 1 1   Change in appetite 1 0   Feeling bad or failure about  yourself  0 1   Trouble concentrating 1 0   Moving slowly or fidgety/restless 0 0   Suicidal thoughts 0 0   PHQ-9 Score 5 5   Difficult doing work/chores  Somewhat difficult       07/14/2023    1:40 PM 07/03/2023   11:45 AM 10/14/2020    7:41 PM  GAD 7 : Generalized Anxiety Score  Nervous, Anxious, on Edge 1 0 0  Control/stop worrying 1 1 0  Worry too much - different things 1 1 2   Trouble relaxing 1 1 1   Restless 1 0 0  Easily annoyed or irritable 1 0 0  Afraid - awful might happen 1 1 0  Total GAD 7 Score 7 4 3      No results found for any visits on 12/28/23.    Assessment & Plan:   Well adult exam -     CBC with Differential/Platelet; Future -     Comprehensive metabolic panel with GFR; Future -     Lipid panel; Future -     TSH; Future  Pure hypertriglyceridemia -     Lipid panel; Future  Vitamin D  deficiency -     VITAMIN D  25 Hydroxy (Vit-D Deficiency, Fractures); Future  Chronic cough -     Albuterol  Sulfate HFA; Inhale 2 puffs into the lungs every 6 (six) hours as needed for wheezing or shortness of breath.  Dispense: 8.5 g; Refill: 1  Rheumatoid arthritis involving multiple sites with positive rheumatoid factor (HCC) -     CBC with Differential/Platelet; Future -     Comprehensive metabolic panel with GFR; Future -     C-reactive protein; Future -     Sedimentation rate; Future  Colon cancer screening -     Ambulatory referral to Gastroenterology  Lipoma of neck  Age-appropriate health screenings discussed.  Obtain labs.  Immunizations reviewed.  Advised current medications may decrease effectiveness of some immunizations.  Order for colonoscopy placed.  Mammogram scheduled.  Pap with OB/GYN.  Additional labs per rheumatology request ordered.  RA stable.  Continue Enbrel .  Discussed possible causes of cough including GERD, seasonal allergies, reactive airway/asthma.  Albuterol  inhaler as needed for cough.  Continue allergy medication consistently to see if  notices improvement  in symptoms.  Discussed removal options for lipoma.  Pt wishes to monitor at this time.  Return in about 6 weeks (around 02/08/2024), or if symptoms worsen or fail to improve.   Clotilda JONELLE Single, MD

## 2024-01-02 ENCOUNTER — Other Ambulatory Visit: Payer: Self-pay | Admitting: Family Medicine

## 2024-01-02 DIAGNOSIS — R928 Other abnormal and inconclusive findings on diagnostic imaging of breast: Secondary | ICD-10-CM

## 2024-01-03 ENCOUNTER — Telehealth: Payer: Self-pay | Admitting: Family Medicine

## 2024-01-03 NOTE — Telephone Encounter (Signed)
 Copied from CRM [id number]. Topic: General - Call Back - No Documentation >> Jan 03, 2024 11:30 AM Rea BROCKS wrote: Reason for CRM:  [facility]  [phone] ext [phone]  Faxed over an urgent form for Dr. Mercer to sign. Patient is scheduled for the 2nd and wants to make sure fax is signed for her.

## 2024-01-03 NOTE — Telephone Encounter (Signed)
 Paper work has been faxed

## 2024-01-05 ENCOUNTER — Encounter: Payer: Self-pay | Admitting: Gastroenterology

## 2024-01-06 ENCOUNTER — Ambulatory Visit
Admission: RE | Admit: 2024-01-06 | Discharge: 2024-01-06 | Disposition: A | Discharge status: Home / Self Care | Source: Ambulatory Visit | Attending: Family Medicine | Admitting: Family Medicine

## 2024-01-06 DIAGNOSIS — R92332 Mammographic heterogeneous density, left breast: Secondary | ICD-10-CM | POA: Diagnosis not present

## 2024-01-06 DIAGNOSIS — R928 Other abnormal and inconclusive findings on diagnostic imaging of breast: Secondary | ICD-10-CM

## 2024-01-08 ENCOUNTER — Encounter

## 2024-01-13 ENCOUNTER — Ambulatory Visit: Payer: Self-pay | Admitting: Family Medicine

## 2024-01-15 ENCOUNTER — Encounter (INDEPENDENT_AMBULATORY_CARE_PROVIDER_SITE_OTHER): Payer: Self-pay

## 2024-01-16 ENCOUNTER — Other Ambulatory Visit (HOSPITAL_COMMUNITY): Payer: Self-pay

## 2024-01-16 ENCOUNTER — Other Ambulatory Visit: Payer: Self-pay

## 2024-01-16 NOTE — Progress Notes (Signed)
 Specialty Pharmacy Refill Coordination Note  MyChart Questionnaire Submission  Lisa Chase is a 53 y.o. female contacted today regarding refills of specialty medication(s) Enbrel .  Doses on hand: (Patient-Rptd) One (for 01/21/24)  Injection date: 01/28/24  Patient requested: (Patient-Rptd) Delivery   Delivery date: 01/18/24   Verified address: [address] [place] [address]  Medication will be filled on 01/17/24.

## 2024-01-22 ENCOUNTER — Other Ambulatory Visit (HOSPITAL_BASED_OUTPATIENT_CLINIC_OR_DEPARTMENT_OTHER): Payer: Self-pay

## 2024-02-06 ENCOUNTER — Telehealth: Payer: Self-pay | Admitting: Pharmacist

## 2024-02-06 NOTE — Telephone Encounter (Signed)
 Called patient to schedule an appointment for the [facility]. I was unable to reach the patient so I left a HIPAA-compliant message requesting that the patient return my call.   Herlene Fleeta Morris, PharmD, JAQUELINE, CPP Clinical Pharmacist [facility] [phone]

## 2024-02-07 ENCOUNTER — Other Ambulatory Visit (HOSPITAL_COMMUNITY): Payer: Self-pay

## 2024-02-12 ENCOUNTER — Ambulatory Visit (AMBULATORY_SURGERY_CENTER): Admitting: *Deleted

## 2024-02-12 ENCOUNTER — Other Ambulatory Visit (HOSPITAL_BASED_OUTPATIENT_CLINIC_OR_DEPARTMENT_OTHER): Payer: Self-pay

## 2024-02-12 ENCOUNTER — Encounter: Payer: Self-pay | Admitting: Gastroenterology

## 2024-02-12 VITALS — Ht 63.5 in | Wt 173.0 lb

## 2024-02-12 DIAGNOSIS — Z1211 Encounter for screening for malignant neoplasm of colon: Secondary | ICD-10-CM

## 2024-02-12 MED ORDER — NA SULFATE-K SULFATE-MG SULF 17.5-3.13-1.6 GM/177ML PO SOLN
1.0000 | Freq: Once | ORAL | 0 refills | Status: AC
  Filled 2024-02-12: qty 354, 1d supply, fill #0

## 2024-02-12 NOTE — Progress Notes (Signed)
 Pt's name and DOB verified at the beginning of the pre-visit with 2 identifiers  Pt denies any difficulty with ambulating,sitting, laying down or rolling side to side  Pt has no issues moving head neck or swallowing  No egg or soy allergy known to patient   No issues known to pt with past sedation  No FH of Malignant Hyperthermia  Pt is not on home 02   Pt is not on blood thinners   Pt denies issues with constipation   Pt is not on dialysis  Pt denise any abnormal heart rhythms   Pt denies any upcoming cardiac testing  Patient's chart reviewed by Norleen Schillings CNRA prior to pre-visit and patient appropriate for the [facility].  Pre-visit completed and red dot placed by patient's name on their procedure day (on provider's schedule).    Visit by phone  Pt states weight is 173 lb  Pt given  both [facility] main # and MD on call # prior to instructions.  Informed pt to come in at the time discussed and is shown on PV instructions.  Pt instructed to use Single[employer] or GoodRx for a price reduction on prep  Instructed pt where to find PV instructions in My Chart  Instructed pt on all aspects of written instructions including med holds clothing to wear and foods to eat and not eat as well as after procedure legal restrictions and to call MD on call if needed.. Pt states understanding. Instructed pt to review instructions again prior to procedure and call main # given if has any questions or any issues. Pt states they will.

## 2024-02-14 ENCOUNTER — Other Ambulatory Visit: Payer: Self-pay

## 2024-02-14 ENCOUNTER — Encounter (INDEPENDENT_AMBULATORY_CARE_PROVIDER_SITE_OTHER): Payer: Self-pay

## 2024-02-14 NOTE — Progress Notes (Signed)
 Specialty Pharmacy Refill Coordination Note  Lisa Chase is a 53 y.o. female contacted today regarding refills of specialty medication(s) Etanercept  (Enbrel  SureClick)   Patient requested (Patient-Rptd) Delivery   Delivery date: 02/16/24   Verified address: (Patient-Rptd) [address] [place] [address]   Medication will be filled on 02/15/24.

## 2024-02-26 ENCOUNTER — Encounter: Payer: Self-pay | Admitting: Gastroenterology

## 2024-02-26 ENCOUNTER — Ambulatory Visit (AMBULATORY_SURGERY_CENTER): Admitting: Gastroenterology

## 2024-02-26 VITALS — BP 114/54 | HR 63 | Temp 97.6°F | Resp 18 | Ht 63.5 in | Wt 173.0 lb

## 2024-02-26 DIAGNOSIS — K648 Other hemorrhoids: Secondary | ICD-10-CM | POA: Diagnosis not present

## 2024-02-26 DIAGNOSIS — M069 Rheumatoid arthritis, unspecified: Secondary | ICD-10-CM | POA: Diagnosis not present

## 2024-02-26 DIAGNOSIS — Z1211 Encounter for screening for malignant neoplasm of colon: Secondary | ICD-10-CM

## 2024-02-26 DIAGNOSIS — K5730 Diverticulosis of large intestine without perforation or abscess without bleeding: Secondary | ICD-10-CM | POA: Diagnosis not present

## 2024-02-26 MED ORDER — SODIUM CHLORIDE 0.9 % IV SOLN: 500.0000 mL | Freq: Once | INTRAVENOUS | Status: DC

## 2024-02-26 NOTE — Progress Notes (Signed)
 Pt's states no medical or surgical changes since previsit or office visit.

## 2024-02-26 NOTE — Op Note (Signed)
 [facility] Patient Name: Lisa Chase Procedure Date: 02/26/2024 8:10 AM MRN: [id number] Endoscopist: Victory L. Legrand , MD, [id number] Age: 53 Referring MD:  Date of Birth: [birth year] Gender: Female Account #: [id number] Procedure:                Colonoscopy Indications:              Screening for colorectal malignant neoplasm, This                            is the patient's first colonoscopy Medicines:                Monitored Anesthesia Care Procedure:                Pre-Anesthesia Assessment:                           - Prior to the procedure, a History and Physical                            was performed, and patient medications and                            allergies were reviewed. The patient's tolerance of                            previous anesthesia was also reviewed. The risks                            and benefits of the procedure and the sedation                            options and risks were discussed with the patient.                            All questions were answered, and informed consent                            was obtained. Prior Anticoagulants: The patient has                            taken no anticoagulant or antiplatelet agents. ASA                            Grade Assessment: II - A patient with mild systemic                            disease. After reviewing the risks and benefits,                            the patient was deemed in satisfactory condition to                            undergo the procedure.  After obtaining informed consent, the colonoscope                            was passed under direct vision. Throughout the                            procedure, the patient's blood pressure, pulse, and                            oxygen  saturations were monitored continuously. The                            CF [id number] #[id number] was introduced through the anus                            and advanced to  the the cecum, identified by                            appendiceal orifice and ileocecal valve. The                            colonoscopy was performed without difficulty. The                            patient tolerated the procedure well. The quality                            of the bowel preparation was excellent. The                            ileocecal valve, appendiceal orifice, and rectum                            were photographed. Scope In: 8:26:35 AM Scope Out: 8:40:52 AM Scope Withdrawal Time: 0 hours 9 minutes 9 seconds  Total Procedure Duration: 0 hours 14 minutes 17 seconds  Findings:                 The perianal and digital rectal examinations were                            normal.                           Repeat examination of right colon under NBI                            performed.                           Multiple diverticula were found in the left colon.                           Internal hemorrhoids were found. The hemorrhoids  were small.                           The exam was otherwise without abnormality on                            direct and retroflexion views. Complications:            No immediate complications. Estimated Blood Loss:     Estimated blood loss: none. Impression:               - Diverticulosis in the left colon.                           - Internal hemorrhoids.                           - The examination was otherwise normal on direct                            and retroflexion views.                           - No specimens collected. Recommendation:           - Patient has a contact number available for                            emergencies. The signs and symptoms of potential                            delayed complications were discussed with the                            patient. Return to normal activities tomorrow.                            Written discharge instructions were provided to the                             patient.                           - Resume previous diet.                           - Continue present medications.                           - Repeat colonoscopy in 10 years for screening                            purposes. [name] L. Legrand, MD 02/26/2024 8:43:59 AM This report has been signed electronically.

## 2024-02-26 NOTE — Progress Notes (Signed)
 History and Physical:  This patient presents for endoscopic testing for: Encounter Diagnosis  Name Primary?   Special screening for malignant neoplasms, colon Yes    Average risk for colorectal cancer.  1st screening exam.  Patient denies chronic abdominal pain, rectal bleeding, constipation or diarrhea.   Patient is otherwise without complaints or active issues today.   Past Medical History: Past Medical History:  Diagnosis Date   Arthritis    Rheumatoid dx 2010 on Methotrexate   Chronic cough    Seasonal   GERD (gastroesophageal reflux disease)      Past Surgical History: History reviewed. No pertinent surgical history.  Allergies: No Known Allergies  Outpatient Meds: Current Outpatient Medications  Medication Sig Dispense Refill   folic acid  (FOLVITE ) 1 MG tablet Take 1 mg by mouth every other day.     hydroxychloroquine  (PLAQUENIL ) 200 MG tablet Take 1 tablet by mouth once daily for 2 weeks, then 2 tablets once daily thereafter 42 tablet 0   Multiple Vitamin (MULTIVITAMIN ADULT PO) Take by mouth daily at 12 noon.     Pediatric Multivitamins-Fl (MULT-VITAMIN/FLUORIDE PO)      albuterol  (PROVENTIL ) (2.5 MG/3ML) 0.083% nebulizer solution Take 3 mLs (2.5 mg total) by nebulization every 4 (four) hours as needed for wheezing or shortness of breath. 75 mL 1   albuterol  (VENTOLIN  HFA) 108 (90 Base) MCG/ACT inhaler Inhale 2 puffs into the lungs every 6 (six) hours as needed for wheezing or shortness of breath. (Patient not taking: Reported on 02/12/2024) 6.7 g 1   benzonatate  (TESSALON ) 200 MG capsule Take 1 capsule (200 mg total) by mouth 2 (two) times daily as needed for cough. 20 capsule 0   Cholecalciferol (VITAMIN D3 GUMMIES PO) Take by mouth. (Patient not taking: Reported on 02/12/2024)     etanercept  (ENBREL  SURECLICK) 50 MG/ML injection Inject 50 mg into the skin once a week. 12 mL 2   fluticasone  (FLONASE ) 50 MCG/ACT nasal spray Place 1 spray into both nostrils daily.  (Patient not taking: Reported on 02/12/2024) 16 g 6   HYDROcodone  bit-homatropine (HYCODAN) 5-1.5 MG/5ML syrup Take 5 mLs by mouth every 4 (four) hours as needed. (Patient not taking: Reported on 02/12/2024) 240 mL 0   hydroxychloroquine  (PLAQUENIL ) 200 MG tablet Take 2 tablets (400 mg total) by mouth daily. (Patient not taking: Reported on 02/12/2024) 180 tablet 1   predniSONE  (DELTASONE ) 5 MG tablet Take 1 tablet (5 mg total) by mouth daily with food or milk as needed for joint swelling. (Patient taking differently: Take 5 mg by mouth as needed.) 30 tablet 2   tobramycin -dexamethasone  (TOBRADEX ) ophthalmic solution Place 2 drops into the left eye every 4 (four) hours while awake. 5 mL 0   VITAMIN D  PO Take by mouth.     Current Facility-Administered Medications  Medication Dose Route Frequency Provider Last Rate Last Admin   0.9 %  sodium chloride  infusion  500 mL Intravenous Once Danis, Victory CROME III, MD          ___________________________________________________________________ Objective   Exam:  BP (!) 150/72   Pulse 75   Temp 97.6 F (36.4 C) (Temporal)   Ht 5' 3.5 (1.613 m)   Wt 173 lb (78.5 kg)   SpO2 94%   BMI 30.16 kg/m   CV: regular , S1/S2 Resp: clear to auscultation bilaterally, normal RR and effort noted GI: soft, no tenderness, with active bowel sounds.   Assessment: Encounter Diagnosis  Name Primary?   Special screening for malignant neoplasms,  colon Yes     Plan: Colonoscopy   The benefits and risks of the planned procedure(s) were described in detail with the patient or (when appropriate) their health care proxy.  Risks were outlined as including, but not limited to, bleeding, infection, perforation, adverse medication reaction leading to cardiac or pulmonary decompensation, pancreatitis (if ERCP).  The limitation of incomplete mucosal visualization was also discussed.  No guarantees or warranties were given.  The patient is appropriate for an endoscopic  procedure in the ambulatory setting.   - Victory Brand, MD

## 2024-02-26 NOTE — Progress Notes (Signed)
 Report to PACU, RN, vss, BBS= Clear.

## 2024-02-26 NOTE — Patient Instructions (Signed)
 Discharge instructions given. Handouts on Diverticulosis and Hemorrhoids. Resume previous medications. YOU HAD AN ENDOSCOPIC PROCEDURE TODAY AT THE [facility]:   Refer to the procedure report that was given to you for any specific questions about what was found during the examination.  If the procedure report does not answer your questions, please call your gastroenterologist to clarify.  If you requested that your care partner not be given the details of your procedure findings, then the procedure report has been included in a sealed envelope for you to review at your convenience later.  YOU SHOULD EXPECT: Some feelings of bloating in the abdomen. Passage of more gas than usual.  Walking can help get rid of the air that was put into your GI tract during the procedure and reduce the bloating. If you had a lower endoscopy (such as a colonoscopy or flexible sigmoidoscopy) you may notice spotting of blood in your stool or on the toilet paper. If you underwent a bowel prep for your procedure, you may not have a normal bowel movement for a few days.  Please Note:  You might notice some irritation and congestion in your nose or some drainage.  This is from the oxygen used during your procedure.  There is no need for concern and it should clear up in a day or so.  SYMPTOMS TO REPORT IMMEDIATELY:  Following lower endoscopy (colonoscopy or flexible sigmoidoscopy):  Excessive amounts of blood in the stool  Significant tenderness or worsening of abdominal pains  Swelling of the abdomen that is new, acute  Fever of 100F or higher   For urgent or emergent issues, a gastroenterologist can be reached at any hour by calling [phone]. Do not use MyChart messaging for urgent concerns.    DIET:  We do recommend a small meal at first, but then you may proceed to your regular diet.  Drink plenty of fluids but you should avoid alcoholic beverages for 24 hours.  ACTIVITY:  You should plan to  take it easy for the rest of today and you should NOT DRIVE or use heavy machinery until tomorrow (because of the sedation medicines used during the test).    FOLLOW UP: Our staff will call the number listed on your records the next business day following your procedure.  We will call around 7:15- 8:00 am to check on you and address any questions or concerns that you may have regarding the information given to you following your procedure. If we do not reach you, we will leave a message.     If any biopsies were taken you will be contacted by phone or by letter within the next 1-3 weeks.  Please call us  at [phone] if you have not heard about the biopsies in 3 weeks.    SIGNATURES/CONFIDENTIALITY: You and/or your care partner have signed paperwork which will be entered into your electronic medical record.  These signatures attest to the fact that that the information above on your After Visit Summary has been reviewed and is understood.  Full responsibility of the confidentiality of this discharge information lies with you and/or your care-partner.

## 2024-02-27 ENCOUNTER — Telehealth: Payer: Self-pay

## 2024-02-27 NOTE — Telephone Encounter (Signed)
  Follow up Call-     02/26/2024    8:01 AM  Call back number  Post procedure Call Back phone  # [phone]  Permission to leave phone message Yes    Attempted to call patient regarding follow-up. No answer, VM left.

## 2024-03-07 ENCOUNTER — Other Ambulatory Visit: Payer: Self-pay | Admitting: Pharmacy Technician

## 2024-03-07 ENCOUNTER — Encounter (INDEPENDENT_AMBULATORY_CARE_PROVIDER_SITE_OTHER): Payer: Self-pay

## 2024-03-07 ENCOUNTER — Other Ambulatory Visit: Payer: Self-pay

## 2024-03-07 NOTE — Progress Notes (Signed)
 Specialty Pharmacy Refill Coordination Note  Lisa Chase is a 53 y.o. female contacted today regarding refills of specialty medication(s) Etanercept  (Enbrel  SureClick)   Patient requested (Patient-Rptd) Delivery   Delivery date: 03/14/24 Verified address: (Patient-Rptd) [address] [facility]. [phone]   Medication will be filled on 03/13/24.

## 2024-03-13 ENCOUNTER — Other Ambulatory Visit: Payer: Self-pay

## 2024-03-13 ENCOUNTER — Other Ambulatory Visit (HOSPITAL_BASED_OUTPATIENT_CLINIC_OR_DEPARTMENT_OTHER): Payer: Self-pay

## 2024-03-13 MED ORDER — FLUZONE 0.5 ML IM SUSY
0.5000 mL | PREFILLED_SYRINGE | Freq: Once | INTRAMUSCULAR | 0 refills | Status: AC
  Filled 2024-03-13: qty 0.5, 1d supply, fill #0

## 2024-03-22 ENCOUNTER — Other Ambulatory Visit (HOSPITAL_BASED_OUTPATIENT_CLINIC_OR_DEPARTMENT_OTHER): Payer: Self-pay

## 2024-04-01 ENCOUNTER — Other Ambulatory Visit (HOSPITAL_BASED_OUTPATIENT_CLINIC_OR_DEPARTMENT_OTHER): Payer: Self-pay

## 2024-04-03 ENCOUNTER — Other Ambulatory Visit: Payer: Self-pay

## 2024-04-04 ENCOUNTER — Other Ambulatory Visit: Payer: Self-pay

## 2024-04-04 NOTE — Progress Notes (Signed)
 Specialty Pharmacy Refill Coordination Note  Momo Braun Palomarez is a 53 y.o. female contacted today regarding refills of specialty medication(s) Etanercept  (Enbrel  SureClick)   Patient requested Delivery   Delivery date: 04/12/24   Verified address: [address] [facility]. [phone]   Medication will be filled on: 04/11/24   Sent patient mychart message with anticipated delivery date.

## 2024-04-09 ENCOUNTER — Other Ambulatory Visit (HOSPITAL_BASED_OUTPATIENT_CLINIC_OR_DEPARTMENT_OTHER): Payer: Self-pay

## 2024-04-09 ENCOUNTER — Other Ambulatory Visit: Payer: Self-pay

## 2024-04-09 DIAGNOSIS — R7611 Nonspecific reaction to tuberculin skin test without active tuberculosis: Secondary | ICD-10-CM | POA: Diagnosis not present

## 2024-04-09 DIAGNOSIS — M8580 Other specified disorders of bone density and structure, unspecified site: Secondary | ICD-10-CM | POA: Diagnosis not present

## 2024-04-09 DIAGNOSIS — M0589 Other rheumatoid arthritis with rheumatoid factor of multiple sites: Secondary | ICD-10-CM | POA: Diagnosis not present

## 2024-04-09 DIAGNOSIS — Z79899 Other long term (current) drug therapy: Secondary | ICD-10-CM | POA: Diagnosis not present

## 2024-04-09 MED ORDER — ENBREL SURECLICK 50 MG/ML ~~LOC~~ SOAJ
50.0000 mg | SUBCUTANEOUS | 5 refills | Status: AC
  Filled 2024-05-07 – 2024-06-04 (×2): qty 4, 28d supply, fill #0

## 2024-04-09 MED ORDER — HYDROXYCHLOROQUINE SULFATE 200 MG PO TABS
400.0000 mg | ORAL_TABLET | Freq: Every day | ORAL | 1 refills | Status: AC
  Filled 2024-04-09: qty 180, 90d supply, fill #0

## 2024-04-10 ENCOUNTER — Other Ambulatory Visit (HOSPITAL_BASED_OUTPATIENT_CLINIC_OR_DEPARTMENT_OTHER): Payer: Self-pay

## 2024-04-11 ENCOUNTER — Other Ambulatory Visit: Payer: Self-pay

## 2024-04-13 DIAGNOSIS — H5213 Myopia, bilateral: Secondary | ICD-10-CM | POA: Diagnosis not present

## 2024-04-13 DIAGNOSIS — H52223 Regular astigmatism, bilateral: Secondary | ICD-10-CM | POA: Diagnosis not present

## 2024-04-13 DIAGNOSIS — H524 Presbyopia: Secondary | ICD-10-CM | POA: Diagnosis not present

## 2024-04-30 ENCOUNTER — Other Ambulatory Visit: Payer: Self-pay

## 2024-04-30 ENCOUNTER — Telehealth: Payer: Self-pay

## 2024-04-30 NOTE — Telephone Encounter (Signed)
 Pharmacy Patient Advocate Encounter   Received notification from Onbase that prior authorization for Enbrel  is required/requested.   Insurance verification completed.   The patient is insured through [facility].   Per test claim: PA required; PA submitted to above mentioned insurance via Latent Key/confirmation #/EOC [facility] Status is pending

## 2024-05-01 ENCOUNTER — Other Ambulatory Visit: Payer: Self-pay

## 2024-05-01 NOTE — Telephone Encounter (Signed)
 Pharmacy Patient Advocate Encounter  Received notification from [facility] that Prior Authorization for Enbrel  has been APPROVED from 04/30/24 to 04/29/25   PA #/Case ID/Reference #: [id number]

## 2024-05-03 ENCOUNTER — Other Ambulatory Visit (HOSPITAL_COMMUNITY): Payer: Self-pay

## 2024-05-07 ENCOUNTER — Other Ambulatory Visit: Payer: Self-pay | Admitting: Pharmacy Technician

## 2024-05-07 ENCOUNTER — Other Ambulatory Visit: Payer: Self-pay

## 2024-05-07 ENCOUNTER — Other Ambulatory Visit (HOSPITAL_COMMUNITY): Payer: Self-pay

## 2024-05-07 NOTE — Progress Notes (Signed)
 Specialty Pharmacy Refill Coordination Note  Lisa Chase is a 53 y.o. female contacted today regarding refills of specialty medication(s) Etanercept  (Enbrel  SureClick)   Patient requested (Patient-Rptd) Delivery   Delivery date: 05/16/2024  Verified address: (Patient-Rptd) [address] [facility]. [phone]   Medication will be filled on: 05/15/2024

## 2024-05-15 ENCOUNTER — Other Ambulatory Visit: Payer: Self-pay

## 2024-06-04 ENCOUNTER — Other Ambulatory Visit: Payer: Self-pay

## 2024-06-05 ENCOUNTER — Other Ambulatory Visit (HOSPITAL_COMMUNITY): Payer: Self-pay

## 2024-06-05 ENCOUNTER — Other Ambulatory Visit: Payer: Self-pay

## 2024-06-05 NOTE — Progress Notes (Signed)
 Specialty Pharmacy Refill Coordination Note  Lisa Chase is a 53 y.o. female contacted today regarding refills of specialty medication(s) Etanercept  (Enbrel  SureClick)   Patient requested Delivery   Delivery date: 06/12/24   Verified address: [address] [place] [address]   Medication will be filled on: 06/11/24

## 2024-06-11 ENCOUNTER — Other Ambulatory Visit: Payer: Self-pay

## 2024-06-14 ENCOUNTER — Encounter: Payer: Self-pay | Admitting: Adult Health

## 2024-06-14 ENCOUNTER — Ambulatory Visit

## 2024-06-14 ENCOUNTER — Ambulatory Visit: Admitting: Adult Health

## 2024-06-14 ENCOUNTER — Other Ambulatory Visit (HOSPITAL_BASED_OUTPATIENT_CLINIC_OR_DEPARTMENT_OTHER): Payer: Self-pay

## 2024-06-14 VITALS — BP 120/82 | HR 74 | Temp 98.6°F | Ht 63.5 in | Wt 176.0 lb

## 2024-06-14 DIAGNOSIS — R053 Chronic cough: Secondary | ICD-10-CM | POA: Diagnosis not present

## 2024-06-14 DIAGNOSIS — J40 Bronchitis, not specified as acute or chronic: Secondary | ICD-10-CM | POA: Diagnosis not present

## 2024-06-14 MED ORDER — HYDROCODONE BIT-HOMATROP MBR 5-1.5 MG/5ML PO SOLN
5.0000 mL | ORAL | 0 refills | Status: AC | PRN
  Filled 2024-06-14: qty 240, 8d supply, fill #0

## 2024-06-14 MED ORDER — METHYLPREDNISOLONE 4 MG PO TBPK
ORAL_TABLET | ORAL | 0 refills | Status: AC
  Filled 2024-06-14: qty 21, 6d supply, fill #0

## 2024-06-14 MED ORDER — ALBUTEROL SULFATE HFA 108 (90 BASE) MCG/ACT IN AERS
2.0000 | INHALATION_SPRAY | Freq: Four times a day (QID) | RESPIRATORY_TRACT | 1 refills | Status: AC | PRN
  Filled 2024-06-14: qty 6.7, 23d supply, fill #0

## 2024-06-14 MED ORDER — BENZONATATE 200 MG PO CAPS
200.0000 mg | ORAL_CAPSULE | Freq: Two times a day (BID) | ORAL | 0 refills | Status: AC | PRN
  Filled 2024-06-14: qty 20, 10d supply, fill #0

## 2024-06-14 MED ORDER — ALBUTEROL SULFATE (2.5 MG/3ML) 0.083% IN NEBU
2.5000 mg | INHALATION_SOLUTION | RESPIRATORY_TRACT | 1 refills | Status: AC | PRN
  Filled 2024-06-14: qty 75, 5d supply, fill #0

## 2024-06-14 NOTE — Progress Notes (Signed)
 "  Subjective:    Patient ID: Lisa Chase, female    DOB: [birth year], 54 y.o.   MRN: [id number]  HPI  Discussed the use of AI scribe software for clinical note transcription with the patient, who gave verbal consent to proceed.  History of Present Illness   Lisa Chase is a 54 year old femalewho presents with a persistent cough.  She has had a persistent dry cough for about three weeks with sneezing, malaise, and nighttime wheezing that disrupts sleep. Over-the-counter Robitussin and Vicks provide little relief, especially at night.  She has no fever, chills, nausea, vomiting, diarrhea, or chest tightness, but occasionally feels short of breath.  She often develops a cough with weather changes. In the past, she has used Zithromax , an inhaler, cough medication, Tessalon  Perles, and a Medrol  Dose Pack for similar symptoms. She is using an albuterol  inhaler now and has a home nebulizer but is unsure if she has the solution for it.       Review of Systems See HPI   Past Medical History:  Diagnosis Date   Arthritis    Rheumatoid dx 2010 on Methotrexate   Chronic cough    Seasonal   GERD (gastroesophageal reflux disease)     Social History   Socioeconomic History   Marital status: Married    Spouse name: Not on file   Number of children: Not on file   Years of education: Not on file   Highest education level: Associate degree: academic program  Occupational History   Not on file  Tobacco Use   Smoking status: Never   Smokeless tobacco: Never  Vaping Use   Vaping status: Never Used  Substance and Sexual Activity   Alcohol use: No   Drug use: No   Sexual activity: Yes    Birth control/protection: None, Post-menopausal  Other Topics Concern   Not on file  Social History Narrative   Not on file   Social Drivers of Health   Tobacco Use: Low Risk (06/14/2024)   Patient History    Smoking Tobacco Use: Never    Smokeless Tobacco Use: Never     Passive Exposure: Not on file  Financial Resource Strain: Low Risk (06/29/2023)   Overall Financial Resource Strain (CARDIA)    Difficulty of Paying Living Expenses: Not very hard  Food Insecurity: No Food Insecurity (04/18/2023)   Hunger Vital Sign    Worried About Running Out of Food in the Last Year: Never true    Ran Out of Food in the Last Year: Never true  Transportation Needs: No Transportation Needs (06/29/2023)   PRAPARE - Administrator, Civil Service (Medical): No    Lack of Transportation (Non-Medical): No  Physical Activity: Insufficiently Active (06/29/2023)   Exercise Vital Sign    Days of Exercise per Week: 3 days    Minutes of Exercise per Session: 40 min  Stress: Stress Concern Present (06/29/2023)   [employer] of Occupational Health - Occupational Stress Questionnaire    Feeling of Stress : To some extent  Social Connections: Socially Integrated (06/29/2023)   Social Connection and Isolation Panel    Frequency of Communication with Friends and Family: Twice a week    Frequency of Social Gatherings with Friends and Family: Once a week    Attends Religious Services: 1 to 4 times per year    Active Member of [employer] or Organizations: Yes    Attends Banker Meetings: 1 to 4 times  per year    Marital Status: Married  Catering Manager Violence: Not on file  Depression (PHQ2-9): Low Risk (12/28/2023)   Depression (PHQ2-9)    PHQ-2 Score: 3  Alcohol Screen: Not on file  Housing: Unknown (06/29/2023)   Housing Stability Vital Sign    Unable to Pay for Housing in the Last Year: Not on file    Number of Times Moved in the Last Year: Not on file    Homeless in the Last Year: No  Utilities: Not on file  Health Literacy: Not on file    History reviewed. No pertinent surgical history.  Family History  Problem Relation Age of Onset   Heart disease Mother    BRCA 1/2 Neg Hx    Breast cancer Neg Hx    Colon cancer Neg Hx    Colon polyps Neg Hx     Esophageal cancer Neg Hx    Rectal cancer Neg Hx    Stomach cancer Neg Hx     Allergies[1]  Medications Ordered Prior to Encounter[2]  BP 120/82   Pulse 74   Temp 98.6 F (37 C) (Oral)   Ht 5' 3.5 (1.613 m)   Wt 176 lb (79.8 kg)   SpO2 97%   BMI 30.69 kg/m       Objective:   Physical Exam Vitals and nursing note reviewed.  Constitutional:      Appearance: She is well-developed.  HENT:     Nose: Nose normal. No congestion or rhinorrhea.     Mouth/Throat:     Mouth: Mucous membranes are moist.     Pharynx: Oropharynx is clear.  Cardiovascular:     Rate and Rhythm: Regular rhythm.     Pulses: Normal pulses.     Heart sounds: Normal heart sounds.  Pulmonary:     Effort: Pulmonary effort is normal.     Breath sounds: No stridor. Wheezing (trace expiratory wheezing throughout) present. No rhonchi.  Neurological:     General: No focal deficit present.     Mental Status: She is alert and oriented to person, place, and time.  Psychiatric:        Mood and Affect: Mood normal.        Behavior: Behavior normal.        Thought Content: Thought content normal.        Assessment & Plan:   Assessment and Plan    Bronchitis Recurrent bronchitis with persistent dry cough and wheezing. Likely viral, but chest x-ray needed due to immunosuppressant - Ordered chest x-ray - Prescribed Tessalon  Perles for cough. - Prescribed albuterol  inhaler for wheezing. - Prescribed nebulizer solution for home use. - Will follow up with chest x-ray results and adjust treatment if necessary.      I personally spent a total of 31 minutes in the care of the patient today including preparing to see the patient, getting/reviewing separately obtained history, performing a medically appropriate exam/evaluation, counseling and educating, placing orders, and documenting clinical information in the EHR.     [1] No Known Allergies [2]  Current Outpatient Medications on File Prior to Visit   Medication Sig Dispense Refill   etanercept  (ENBREL  SURECLICK) 50 MG/ML injection Inject 50 mg into the skin once a week. 12 mL 2   etanercept  (ENBREL  SURECLICK) 50 MG/ML injection Inject 50 mg into the skin once a week. 4 mL 5   folic acid  (FOLVITE ) 1 MG tablet Take 1 mg by mouth every other day.  hydroxychloroquine  (PLAQUENIL ) 200 MG tablet Take 1 tablet by mouth once daily for 2 weeks, then 2 tablets once daily thereafter 42 tablet 0   Multiple Vitamin (MULTIVITAMIN ADULT PO) Take by mouth daily at 12 noon.     Pediatric Multivitamins-Fl (MULT-VITAMIN/FLUORIDE PO)      VITAMIN D  PO Take by mouth.     Cholecalciferol (VITAMIN D3 GUMMIES PO) Take by mouth. (Patient not taking: Reported on 06/14/2024)     hydroxychloroquine  (PLAQUENIL ) 200 MG tablet Take 2 tablets (400 mg total) by mouth daily. (Patient not taking: Reported on 06/14/2024) 180 tablet 1   hydroxychloroquine  (PLAQUENIL ) 200 MG tablet Take 2 tablets (400 mg total) by mouth daily. (Patient not taking: Reported on 06/14/2024) 180 tablet 1   predniSONE  (DELTASONE ) 5 MG tablet Take 1 tablet (5 mg total) by mouth daily with food or milk as needed for joint swelling. (Patient not taking: Reported on 06/14/2024) 30 tablet 2   tobramycin -dexamethasone  (TOBRADEX ) ophthalmic solution Place 2 drops into the left eye every 4 (four) hours while awake. 5 mL 0   No current facility-administered medications on file prior to visit.   "

## 2024-06-21 ENCOUNTER — Ambulatory Visit: Payer: Self-pay | Admitting: Adult Health

## 2024-07-01 ENCOUNTER — Other Ambulatory Visit: Payer: Self-pay

## 2024-07-01 NOTE — Progress Notes (Signed)
 Specialty Pharmacy Ongoing Clinical Assessment Note  Lisa Chase is a 54 y.o. female who is being followed by the specialty pharmacy service for RxSp Rheumatoid Arthritis   Patient's specialty medication(s) reviewed today: Etanercept  (Enbrel  SureClick)   Missed doses in the last 4 weeks: 0   Patient/Caregiver did not have any additional questions or concerns.   Therapeutic benefit summary: Patient is achieving benefit   Adverse events/side effects summary: No adverse events/side effects   Patient's therapy is appropriate to: Continue    Goals Addressed             This Visit's Progress    Improve or maintain quality of life   On track    Patient is on track. Patient will maintain adherence         Follow up: 12 months  Silvano LOISE Dolly Specialty Pharmacist

## 2024-07-01 NOTE — Progress Notes (Signed)
 Specialty Pharmacy Refill Coordination Note  Lisa Chase is a 54 y.o. female contacted today regarding refills of specialty medication(s) Etanercept  (Enbrel  SureClick)   Patient requested Delivery   Delivery date: 07/05/24   Verified address: [address] [place] [address]   Medication will be filled on: 07/04/24   This fill date is pending response to refill request from provider. Patient is aware and if they have not received fill by intended date they must follow up with pharmacy.

## 2024-07-02 ENCOUNTER — Other Ambulatory Visit (HOSPITAL_COMMUNITY): Payer: Self-pay

## 2024-07-02 ENCOUNTER — Other Ambulatory Visit: Payer: Self-pay

## 2024-07-02 ENCOUNTER — Telehealth: Payer: Self-pay | Admitting: Pharmacist

## 2024-07-02 MED ORDER — ENBREL SURECLICK 50 MG/ML ~~LOC~~ SOAJ
50.0000 mg | SUBCUTANEOUS | 3 refills | Status: AC
  Filled 2024-07-02: qty 12, 84d supply, fill #0

## 2024-07-02 NOTE — Telephone Encounter (Signed)
 Called patient to schedule an appointment for the [facility]. I was unable to reach the patient so I left a HIPAA-compliant message requesting that the patient return my call.   Lisa Chase, PharmD, JAQUELINE, CPP Clinical Pharmacist [facility] [phone]

## 2024-07-04 ENCOUNTER — Other Ambulatory Visit: Payer: Self-pay
